# Patient Record
Sex: Male | Born: 1985 | Race: Black or African American | Hispanic: No | Marital: Single
Health system: Southern US, Community
[De-identification: ages and names within clinical notes are randomized; demographics above are authoritative.]

## PROBLEM LIST (undated history)

## (undated) DIAGNOSIS — J45909 Unspecified asthma, uncomplicated: Secondary | ICD-10-CM

## (undated) HISTORY — PX: KNEE SURGERY: SHX244

---
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
## 2011-01-22 ENCOUNTER — Emergency Department (HOSPITAL_COMMUNITY)
Admission: EM | Admit: 2011-01-22 | Discharge: 2011-01-22 | Disposition: A | Discharge status: Home / Self Care | Payer: Self-pay | Attending: Emergency Medicine | Admitting: Emergency Medicine

## 2011-01-22 DIAGNOSIS — M25476 Effusion, unspecified foot: Secondary | ICD-10-CM | POA: Insufficient documentation

## 2011-01-22 DIAGNOSIS — M25473 Effusion, unspecified ankle: Secondary | ICD-10-CM | POA: Insufficient documentation

## 2011-01-22 DIAGNOSIS — R609 Edema, unspecified: Secondary | ICD-10-CM | POA: Insufficient documentation

## 2011-01-22 LAB — BASIC METABOLIC PANEL
BUN: 17 mg/dL (ref 6–23)
CO2: 28 mEq/L (ref 19–32)
Calcium: 9.1 mg/dL (ref 8.4–10.5)
GFR calc non Af Amer: >60 mL/min (ref >60)
Glucose, Bld: 91 mg/dL (ref 70–99)
Potassium: 4.1 mEq/L (ref 3.5–5.1)

## 2011-01-22 LAB — CBC
Hemoglobin: 13.6 g/dL (ref 13.0–17.0)
MCH: 28.9 pg (ref 26.0–34.0)
MCHC: 34.9 g/dL (ref 30.0–36.0)
MCV: 82.8 fL (ref 78.0–100.0)

## 2013-01-22 ENCOUNTER — Emergency Department (HOSPITAL_COMMUNITY): Payer: Self-pay

## 2013-01-22 ENCOUNTER — Encounter (HOSPITAL_COMMUNITY): Payer: Self-pay | Admitting: Adult Health

## 2013-01-22 ENCOUNTER — Emergency Department (HOSPITAL_COMMUNITY)
Admission: EM | Admit: 2013-01-22 | Discharge: 2013-01-23 | Disposition: A | Discharge status: Home / Self Care | Payer: Self-pay | Attending: Emergency Medicine | Admitting: Emergency Medicine

## 2013-01-22 DIAGNOSIS — J4521 Mild intermittent asthma with (acute) exacerbation: Secondary | ICD-10-CM

## 2013-01-22 DIAGNOSIS — J45901 Unspecified asthma with (acute) exacerbation: Secondary | ICD-10-CM | POA: Insufficient documentation

## 2013-01-22 DIAGNOSIS — IMO0002 Reserved for concepts with insufficient information to code with codable children: Secondary | ICD-10-CM | POA: Insufficient documentation

## 2013-01-22 DIAGNOSIS — M549 Dorsalgia, unspecified: Secondary | ICD-10-CM | POA: Insufficient documentation

## 2013-01-22 DIAGNOSIS — Z79899 Other long term (current) drug therapy: Secondary | ICD-10-CM | POA: Insufficient documentation

## 2013-01-22 DIAGNOSIS — F17200 Nicotine dependence, unspecified, uncomplicated: Secondary | ICD-10-CM | POA: Insufficient documentation

## 2013-01-22 DIAGNOSIS — R079 Chest pain, unspecified: Secondary | ICD-10-CM | POA: Insufficient documentation

## 2013-01-22 HISTORY — DX: Unspecified asthma, uncomplicated: J45.909

## 2013-01-22 MED ORDER — ALBUTEROL SULFATE HFA 108 (90 BASE) MCG/ACT IN AERS: 2.0000 | INHALATION_SPRAY | RESPIRATORY_TRACT | Status: DC | PRN

## 2013-01-22 MED ORDER — ONDANSETRON 4 MG PO TBDP
8.0000 mg | ORAL_TABLET | Freq: Once | ORAL | Status: AC
  Administered 2013-01-22: 8 mg via ORAL
  Filled 2013-01-22: qty 2

## 2013-01-22 MED ORDER — PREDNISONE 20 MG PO TABS
60.0000 mg | ORAL_TABLET | Freq: Once | ORAL | Status: AC
  Administered 2013-01-22: 60 mg via ORAL
  Filled 2013-01-22: qty 3

## 2013-01-22 MED ORDER — ALBUTEROL SULFATE (5 MG/ML) 0.5% IN NEBU
INHALATION_SOLUTION | RESPIRATORY_TRACT | Status: AC
  Filled 2013-01-22: qty 1

## 2013-01-22 MED ORDER — ALBUTEROL SULFATE (5 MG/ML) 0.5% IN NEBU
5.0000 mg | INHALATION_SOLUTION | Freq: Once | RESPIRATORY_TRACT | Status: AC
  Administered 2013-01-22: 5 mg via RESPIRATORY_TRACT

## 2013-01-22 MED ORDER — IPRATROPIUM BROMIDE 0.02 % IN SOLN
0.5000 mg | Freq: Once | RESPIRATORY_TRACT | Status: AC
  Administered 2013-01-22: 0.5 mg via RESPIRATORY_TRACT

## 2013-01-22 MED ORDER — ALBUTEROL SULFATE HFA 108 (90 BASE) MCG/ACT IN AERS
2.0000 | INHALATION_SPRAY | RESPIRATORY_TRACT | Status: DC | PRN
  Administered 2013-01-23: 2 via RESPIRATORY_TRACT
  Filled 2013-01-22: qty 6.7

## 2013-01-22 MED ORDER — PREDNISONE 20 MG PO TABS: 40.0000 mg | ORAL_TABLET | Freq: Every day | ORAL | Status: DC

## 2013-01-22 MED ORDER — IPRATROPIUM BROMIDE 0.02 % IN SOLN
RESPIRATORY_TRACT | Status: AC
  Filled 2013-01-22: qty 2.5

## 2013-01-22 MED ORDER — IPRATROPIUM BROMIDE 0.02 % IN SOLN
0.5000 mg | Freq: Once | RESPIRATORY_TRACT | Status: AC
  Administered 2013-01-22: 0.5 mg via RESPIRATORY_TRACT
  Filled 2013-01-22 (×2): qty 2.5

## 2013-01-22 MED ORDER — ALBUTEROL SULFATE (5 MG/ML) 0.5% IN NEBU
5.0000 mg | INHALATION_SOLUTION | Freq: Once | RESPIRATORY_TRACT | Status: AC
  Administered 2013-01-22: 5 mg via RESPIRATORY_TRACT
  Filled 2013-01-22 (×2): qty 1

## 2013-01-22 NOTE — ED Notes (Signed)
Presents with 2 weeks of asthma exacerbation. Bilateral inspiratory and expiratory wheezes on auscultation. No use of accessory muscles. Sats 95%

## 2013-01-22 NOTE — ED Provider Notes (Signed)
History     CSN: [id number]  Arrival date & time 01/22/13  2035   First MD Initiated Contact with Patient 01/22/13 2210      Chief Complaint  Patient presents with  . Shortness of Breath    (Consider location/radiation/quality/duration/timing/severity/associated sxs/prior treatment) Patient is a 27 y.o. male presenting with shortness of breath. The history is provided by the patient and medical records. No language interpreter was used.  Shortness of Breath Severity:  Moderate Onset quality:  Gradual Duration:  2 weeks Timing:  Constant Progression:  Worsening Chronicity:  New Context: activity and smoke exposure   Context: not animal exposure, not emotional upset, not fumes, not known allergens, not occupational exposure, not pollens, not strong odors, not URI and not weather changes   Relieved by:  None tried Worsened by:  Activity Ineffective treatments:  None tried Associated symptoms: chest pain   Associated symptoms: no abdominal pain, no cough, no diaphoresis, no ear pain, no fever, no headaches, no hemoptysis, no neck pain, no PND, no rash, no sore throat, no sputum production, no syncope, no swollen glands, no vomiting and no wheezing   Risk factors: tobacco use   Risk factors: no recent alcohol use, no family hx of DVT, no hx of cancer, no hx of PE/DVT, no obesity, no oral contraceptive use, no prolonged immobilization and no recent surgery     Brett Gilbert is a 27 y.o. male  with a hx of asthma presents to the Emergency Department complaining of gradual, persistent, progressively worsening SOB onset 2 weeks ago.  Initially only at night, but progressively worsening to being a problem throughout the day.  Denies URI symptoms or sick contacts. Associated symptoms include wheezing, chest tightness, back pain.  Nothing makes it better and exertion makes it worse.  Pt denies fever, chills, headache, abdominal pain, N/V/D, weakness, dizziness, syncope, dysuria.  Pt's last  exacerbation was 2-3 years ago.   Pt does not take any medications for his asthma on a regular basis.      Past Medical History  Diagnosis Date  . Asthma     History reviewed. No pertinent past surgical history.  History reviewed. No pertinent family history.  History  Substance Use Topics  . Smoking status: Current Every Day Smoker    Types: Cigarettes  . Smokeless tobacco: Not on file  . Alcohol Use: No      Review of Systems  Constitutional: Negative for fever, diaphoresis, appetite change, fatigue and unexpected weight change.  HENT: Negative for ear pain, sore throat, mouth sores, neck pain and neck stiffness.   Eyes: Negative for visual disturbance.  Respiratory: Positive for shortness of breath. Negative for cough, hemoptysis, sputum production, chest tightness and wheezing.   Cardiovascular: Positive for chest pain. Negative for syncope and PND.  Gastrointestinal: Negative for nausea, vomiting, abdominal pain, diarrhea and constipation.  Endocrine: Negative for polydipsia, polyphagia and polyuria.  Genitourinary: Negative for dysuria, urgency, frequency and hematuria.  Musculoskeletal: Negative for back pain.  Skin: Negative for rash.  Allergic/Immunologic: Negative for immunocompromised state.  Neurological: Negative for syncope, light-headedness and headaches.  Hematological: Does not bruise/bleed easily.  Psychiatric/Behavioral: Negative for sleep disturbance. The patient is not nervous/anxious.     Allergies  Review of patient's allergies indicates no known allergies.  Home Medications   Current Outpatient Rx  Name  Route  Sig  Dispense  Refill  . Ibuprofen (IBU PO)   Oral   Take 2-3 tablets by mouth 2 (two) times  daily as needed (pain).         [name] albuterol (PROVENTIL HFA;VENTOLIN HFA) 108 (90 BASE) MCG/ACT inhaler   Inhalation   Inhale 2 puffs into the lungs every 4 (four) hours as needed for wheezing or shortness of breath.   1 Inhaler   3   .  predniSONE (DELTASONE) 20 MG tablet   Oral   Take 2 tablets (40 mg total) by mouth daily.   10 tablet   0     BP 141/84  Pulse 78  Temp(Src) 97.8 F (36.6 C) (Oral)  Resp 18  SpO2 97%  Physical Exam  Nursing note and vitals reviewed. Constitutional: He is oriented to person, place, and time. He appears well-developed and well-nourished. No distress.  HENT:  Head: Normocephalic and atraumatic.  Mouth/Throat: Oropharynx is clear and moist. No oropharyngeal exudate.  Eyes: Conjunctivae are normal. Pupils are equal, round, and reactive to light. No scleral icterus.  Neck: Normal range of motion. Neck supple.  Cardiovascular: Normal rate, regular rhythm, normal heart sounds and intact distal pulses.   No murmur heard. Pulmonary/Chest: Effort normal. No accessory muscle usage. Not tachypneic. No respiratory distress. He has decreased breath sounds. He has wheezes (throughout). He has no rhonchi. He has no rales. He exhibits no tenderness and no bony tenderness.  Abdominal: Soft. Bowel sounds are normal. He exhibits no mass. There is no tenderness. There is no rebound and no guarding.  Musculoskeletal: Normal range of motion. He exhibits no edema.  Lymphadenopathy:    He has no cervical adenopathy.  Neurological: He is alert and oriented to person, place, and time. He exhibits normal muscle tone. Coordination normal.  Speech is clear and goal oriented Moves extremities without ataxia  Skin: Skin is warm and dry. No rash noted. He is not diaphoretic. No erythema.  Psychiatric: He has a normal mood and affect.    ED Course  Procedures (including critical care time)  Labs Reviewed - No data to display Dg Chest 2 View  01/22/2013   *RADIOLOGY REPORT*  Clinical Data: Sudden onset of shortness of breath.  History of asthma.  CHEST - 2 VIEW  Comparison: Head  Findings: Heart, mediastinal, hilar contours are normal.  There is a convex left curvature of the upper thoracic spine, favored to  be due to scoliosis, but could be related to patient positioning. Lung volumes appear within normal limits.  No definite hyperinflation.  The lungs are clear. No acute findings in the bones or upper abdomen.  IMPRESSION:  1.  The lungs are clear. 2.  Question mild levoscoliosis of the upper thoracic spine.   Original Report Authenticated By: Britta Mccreedy, M.D.     1. Asthma flare, mild intermittent       MDM  Andreas Blower presents with asthma exacerbation.  Persistent wheezing; will repeat nebulizer.   11:54 PM Pt with resolution of wheezing.  Patient ambulated in ED with O2 saturations maintained >90, no current signs of respiratory distress. Lung exam improved after nebulizer treatment. Prednisone given in the ED and pt will bd dc with 5 day burst. Pt states they are breathing at baseline. Pt has been instructed to continue using prescribed medications and obtain a PCP for further discussion of today's exacerbation.  I have also discussed reasons to return immediately to the ER.  Patient expresses understanding and agrees with plan.        Dahlia Client Shamone Winzer, PA-C 01/22/13 2354

## 2013-01-22 NOTE — ED Provider Notes (Signed)
Medical screening examination/treatment/procedure(s) were performed by non-physician practitioner and as supervising physician I was immediately available for consultation/collaboration.  Lyanne Co, MD 01/22/13 [phone]

## 2013-10-30 ENCOUNTER — Encounter (HOSPITAL_COMMUNITY): Payer: Self-pay | Admitting: Emergency Medicine

## 2013-10-30 ENCOUNTER — Emergency Department (HOSPITAL_COMMUNITY)
Admission: EM | Admit: 2013-10-30 | Discharge: 2013-10-30 | Disposition: A | Discharge status: Home / Self Care | Payer: BC Managed Care – PPO | Attending: Emergency Medicine | Admitting: Emergency Medicine

## 2013-10-30 ENCOUNTER — Emergency Department (HOSPITAL_COMMUNITY): Payer: BC Managed Care – PPO

## 2013-10-30 DIAGNOSIS — J45901 Unspecified asthma with (acute) exacerbation: Secondary | ICD-10-CM | POA: Insufficient documentation

## 2013-10-30 DIAGNOSIS — J45909 Unspecified asthma, uncomplicated: Secondary | ICD-10-CM

## 2013-10-30 DIAGNOSIS — F17200 Nicotine dependence, unspecified, uncomplicated: Secondary | ICD-10-CM | POA: Insufficient documentation

## 2013-10-30 MED ORDER — ALBUTEROL SULFATE (2.5 MG/3ML) 0.083% IN NEBU
5.0000 mg | INHALATION_SOLUTION | Freq: Once | RESPIRATORY_TRACT | Status: AC
  Administered 2013-10-30: 5 mg via RESPIRATORY_TRACT
  Filled 2013-10-30: qty 6

## 2013-10-30 MED ORDER — ALBUTEROL SULFATE HFA 108 (90 BASE) MCG/ACT IN AERS: 1.0000 | INHALATION_SPRAY | Freq: Four times a day (QID) | RESPIRATORY_TRACT | Status: DC | PRN

## 2013-10-30 MED ORDER — ALBUTEROL SULFATE HFA 108 (90 BASE) MCG/ACT IN AERS
2.0000 | INHALATION_SPRAY | RESPIRATORY_TRACT | Status: DC | PRN
  Filled 2013-10-30: qty 6.7

## 2013-10-30 NOTE — Discharge Instructions (Signed)
Please followup with a primary care provider for continued evaluation and treatment of your asthma.   Asthma, Adult Asthma is a recurring condition in which the airways tighten and narrow. Asthma can make it difficult to breathe. It can cause coughing, wheezing, and shortness of breath. Asthma episodes (also called asthma attacks) range from minor to life-threatening. Asthma cannot be cured, but medicines and lifestyle changes can help control it. CAUSES Asthma is believed to be caused by inherited (genetic) and environmental factors, but its exact cause is unknown. Asthma may be triggered by allergens, lung infections, or irritants in the air. Asthma triggers are different for each person. Common triggers include:   Animal dander.  Dust mites.  Cockroaches.  Pollen from trees or grass.  Mold.  Smoke.  Air pollutants such as dust, household cleaners, hair sprays, aerosol sprays, paint fumes, strong chemicals, or strong odors.  Cold air, weather changes, and winds (which increase molds and pollens in the air).  Strong emotional expressions such as crying or laughing hard.  Stress.  Certain medicines (such as aspirin) or types of drugs (such as beta-blockers).  Sulfites in foods and drinks. Foods and drinks that may contain sulfites include dried fruit, potato chips, and sparkling grape juice.  Infections or inflammatory conditions such as the flu, a cold, or an inflammation of the nasal membranes (rhinitis).  Gastroesophageal reflux disease (GERD).  Exercise or strenuous activity. SYMPTOMS Symptoms may occur immediately after asthma is triggered or many hours later. Symptoms include:  Wheezing.  Excessive nighttime or early morning coughing.  Frequent or severe coughing with a common cold.  Chest tightness.  Shortness of breath. DIAGNOSIS  The diagnosis of asthma is made by a review of your medical history and a physical exam. Tests may also be performed. These may  include:  Lung function studies. These tests show how much air you breath in and out.  Allergy tests.  Imaging tests such as X-rays. TREATMENT  Asthma cannot be cured, but it can usually be controlled. Treatment involves identifying and avoiding your asthma triggers. It also involves medicines. There are 2 classes of medicine used for asthma treatment:   Controller medicines. These prevent asthma symptoms from occurring. They are usually taken every day.  Reliever or rescue medicines. These quickly relieve asthma symptoms. They are used as needed and provide short-term relief. Your health care provider will help you create an asthma action plan. An asthma action plan is a written plan for managing and treating your asthma attacks. It includes a list of your asthma triggers and how they may be avoided. It also includes information on when medicines should be taken and when their dosage should be changed. An action plan may also involve the use of a device called a peak flow meter. A peak flow meter measures how well the lungs are working. It helps you monitor your condition. HOME CARE INSTRUCTIONS   Take medicine as directed by your health care provider. Speak with your health care provider if you have questions about how or when to take the medicines.  Use a peak flow meter as directed by your health care provider. Record and keep track of readings.  Understand and use the action plan to help minimize or stop an asthma attack without needing to seek medical care.  Control your home environment in the following ways to help prevent asthma attacks:  Do not smoke. Avoid being exposed to secondhand smoke.  Change your heating and air conditioning filter regularly.  Limit your use of fireplaces and wood stoves.  Get rid of pests (such as roaches and mice) and their droppings.  Throw away plants if you see mold on them.  Clean your floors and dust regularly. Use unscented cleaning  products.  Try to have someone else vacuum for you regularly. Stay out of rooms while they are being vacuumed and for a short while afterward. If you vacuum, use a dust mask from a hardware store, a double-layered or microfilter vacuum cleaner bag, or a vacuum cleaner with a HEPA filter.  Replace carpet with wood, tile, or vinyl flooring. Carpet can trap dander and dust.  Use allergy-proof pillows, mattress covers, and box spring covers.  Wash bed sheets and blankets every week in hot water and dry them in a dryer.  Use blankets that are made of polyester or cotton.  Clean bathrooms and kitchens with bleach. If possible, have someone repaint the walls in these rooms with mold-resistant paint. Keep out of the rooms that are being cleaned and painted.  Wash hands frequently. SEEK MEDICAL CARE IF:   You have wheezing, shortness of breath, or a cough even if taking medicine to prevent attacks.  The colored mucus you cough up (sputum) is thicker than usual.  Your sputum changes from clear or white to yellow, green, gray, or bloody.  You have any problems that may be related to the medicines you are taking (such as a rash, itching, swelling, or trouble breathing).  You are using a reliever medicine more than 2 3 times per week.  Your peak flow is still at 50 79% of you personal best after following your action plan for 1 hour. SEEK IMMEDIATE MEDICAL CARE IF:   You seem to be getting worse and are unresponsive to treatment during an asthma attack.  You are short of breath even at rest.  You get short of breath when doing very little physical activity.  You have difficulty eating, drinking, or talking due to asthma symptoms.  You develop chest pain.  You develop a fast heartbeat.  You have a bluish color to your lips or fingernails.  You are lightheaded, dizzy, or faint.  Your peak flow is less than 50% of your personal best.  You have a fever or persistent symptoms for more  than 2 3 days.  You have a fever and symptoms suddenly get worse. MAKE SURE YOU:   Understand these instructions.  Will watch your condition.  Will get help right away if you are not doing well or get worse. Document Released: 07/24/2005 Document Revised: 03/26/2013 Document Reviewed: 02/20/2013 [facility] Patient Information 2014 [place], [place].

## 2013-10-30 NOTE — ED Provider Notes (Signed)
CSN: [id number]     Arrival date & time 10/30/13  1921 History   First MD Initiated Contact with Patient 10/30/13 2034     Chief Complaint  Patient presents with  . Asthma   HPI  By the patient. Patient is a [age] year old male with history of asthma and is a current smoker presenting with complaints of persistent asthma attack and wheezing. Patient states that he has had infrequent issues with asthma and usually when he does become symptomatic his symptoms will resolve on their own within 5 hours with rest. His symptoms first began yesterday evening and went through the night and all day today without improvement. He does not have any inhalers at home and do not use any treatments. Symptoms were associated with some productive cough of white phlegm. Denies any associated fever, chills or sweats. No chest pain or hemoptysis. He has otherwise been well recently without any URI symptoms. Denies any other aggravating or alleviating factors. No other associated symptoms.   Past Medical History  Diagnosis Date  . Asthma    History reviewed. No pertinent past surgical history. No family history on file. History  Substance Use Topics  . Smoking status: Current Every Day Smoker    Types: Cigarettes  . Smokeless tobacco: Not on file  . Alcohol Use: No    Review of Systems  Constitutional: Negative for fever, chills and diaphoresis.  Respiratory: Positive for cough, shortness of breath and wheezing.   Cardiovascular: Negative for chest pain and palpitations.  All other systems reviewed and are negative.      Allergies  Review of patient's allergies indicates no known allergies.  Home Medications  No current outpatient prescriptions on file. BP 138/78  Pulse 95  Temp(Src) 97.9 F (36.6 C) (Oral)  Resp 28  Ht 6\' 3"  (1.905 m)  Wt 156 lb (70.761 kg)  BMI 19.50 kg/m2  SpO2 98% Physical Exam  Nursing note and vitals reviewed. Constitutional: He is oriented to person, place, and time.  He appears well-developed and well-nourished. No distress.  HENT:  Head: Normocephalic.  Cardiovascular: Normal rate and regular rhythm.   Pulmonary/Chest: Effort normal and breath sounds normal. No respiratory distress. He has no wheezes. He has no rales.  Abdominal: Soft.  Musculoskeletal: Normal range of motion.  Neurological: He is alert and oriented to person, place, and time.  Skin: Skin is warm.  Psychiatric: He has a normal mood and affect. His behavior is normal.    ED Course  Procedures   DIAGNOSTIC STUDIES: Oxygen Saturation is 98% on room air.  COORDINATION OF CARE:  Nursing notes reviewed. Vital signs reviewed. Initial pt interview and examination performed.   8:54 PM-patient seen and evaluated. Patient received breathing treatments in triage. He states he is feeling significant improved. Normal respirations O2 sats on room air. Lungs are clear this time. Does not appear to have a significant asthma exacerbation and if he'll he will be treated adequately with albuterol inhaler to take home. Will withhold steroids at this time. Patient agrees with this plan.    Treatment plan initiated: Medications  albuterol (PROVENTIL HFA;VENTOLIN HFA) 108 (90 BASE) MCG/ACT inhaler 2 puff (not administered)  albuterol (PROVENTIL) (2.5 MG/3ML) 0.083% nebulizer solution 5 mg (5 mg Nebulization Given 10/30/13 1931)       Imaging Review Dg Chest 2 View  10/30/2013   CLINICAL DATA:  Asthma, wheezing, shortness of breath.  EXAM: CHEST  2 VIEW  COMPARISON:  DG CHEST 2 VIEW dated 01/22/2013  FINDINGS: The cardiomediastinal silhouette is within normal limits. The lungs are well inflated and clear. There is no pleural effusion or pneumothorax. Mild levoscoliosis of the upper thoracic spine is unchanged.  IMPRESSION: Clear lungs.   Electronically Signed   By: Sebastian Ache   On: 10/30/2013 20:33      MDM   Final diagnoses:  Asthma        Angus Seller, PA-C 10/30/13 2104

## 2013-10-30 NOTE — ED Provider Notes (Signed)
Medical screening examination/treatment/procedure(s) were performed by non-physician practitioner and as supervising physician I was immediately available for consultation/collaboration.   EKG Interpretation None        Charles B. Bernette MayersSheldon, MD 10/30/13 2119

## 2013-10-30 NOTE — ED Notes (Signed)
Pt reports "astham attack" this afternoon. Recently seen here for the same, rx with albuterol but states that he lost rx. Pt is no longer SOB, lungs clear throughout. States "I feel a million times better now that I got that breathing tx. I am ready to go." NAD. 100% RA.

## 2013-10-30 NOTE — ED Notes (Signed)
Pt. reports asthma attack with wheezing and occasional productive cough onset last night , pt. has no rescue MDI at home . No fever or chills.

## 2013-11-28 ENCOUNTER — Emergency Department (HOSPITAL_COMMUNITY)
Admission: EM | Admit: 2013-11-28 | Discharge: 2013-11-28 | Disposition: A | Discharge status: Home / Self Care | Payer: BC Managed Care – PPO | Attending: Emergency Medicine | Admitting: Emergency Medicine

## 2013-11-28 ENCOUNTER — Encounter (HOSPITAL_COMMUNITY): Payer: Self-pay | Admitting: Emergency Medicine

## 2013-11-28 ENCOUNTER — Emergency Department (HOSPITAL_COMMUNITY): Payer: BC Managed Care – PPO

## 2013-11-28 DIAGNOSIS — L02519 Cutaneous abscess of unspecified hand: Secondary | ICD-10-CM | POA: Insufficient documentation

## 2013-11-28 DIAGNOSIS — W208XXA Other cause of strike by thrown, projected or falling object, initial encounter: Secondary | ICD-10-CM | POA: Insufficient documentation

## 2013-11-28 DIAGNOSIS — Y9389 Activity, other specified: Secondary | ICD-10-CM | POA: Insufficient documentation

## 2013-11-28 DIAGNOSIS — L0390 Cellulitis, unspecified: Secondary | ICD-10-CM

## 2013-11-28 DIAGNOSIS — Y929 Unspecified place or not applicable: Secondary | ICD-10-CM | POA: Insufficient documentation

## 2013-11-28 DIAGNOSIS — F17200 Nicotine dependence, unspecified, uncomplicated: Secondary | ICD-10-CM | POA: Insufficient documentation

## 2013-11-28 DIAGNOSIS — L03019 Cellulitis of unspecified finger: Secondary | ICD-10-CM

## 2013-11-28 DIAGNOSIS — J45909 Unspecified asthma, uncomplicated: Secondary | ICD-10-CM | POA: Insufficient documentation

## 2013-11-28 DIAGNOSIS — S6990XA Unspecified injury of unspecified wrist, hand and finger(s), initial encounter: Principal | ICD-10-CM | POA: Insufficient documentation

## 2013-11-28 DIAGNOSIS — Z23 Encounter for immunization: Secondary | ICD-10-CM | POA: Insufficient documentation

## 2013-11-28 DIAGNOSIS — S6980XA Other specified injuries of unspecified wrist, hand and finger(s), initial encounter: Secondary | ICD-10-CM | POA: Insufficient documentation

## 2013-11-28 DIAGNOSIS — Z79899 Other long term (current) drug therapy: Secondary | ICD-10-CM | POA: Insufficient documentation

## 2013-11-28 MED ORDER — SULFAMETHOXAZOLE-TMP DS 800-160 MG PO TABS: 1.0000 | ORAL_TABLET | Freq: Two times a day (BID) | ORAL | Status: DC

## 2013-11-28 MED ORDER — TETANUS-DIPHTH-ACELL PERTUSSIS 5-2.5-18.5 LF-MCG/0.5 IM SUSP
0.5000 mL | Freq: Once | INTRAMUSCULAR | Status: AC
  Administered 2013-11-28: 0.5 mL via INTRAMUSCULAR
  Filled 2013-11-28: qty 0.5

## 2013-11-28 MED ORDER — CEPHALEXIN 500 MG PO CAPS: 500.0000 mg | ORAL_CAPSULE | Freq: Four times a day (QID) | ORAL | Status: DC

## 2013-11-28 NOTE — ED Notes (Signed)
Pt reports that he was working an oven and a piece of metal fell onto his right index finger. Reports pain and swelling this morning. Redness noted to the joint

## 2013-11-28 NOTE — ED Provider Notes (Signed)
CSN: [id number]     Arrival date & time 11/28/13  1037 History  This chart was scribed for non-physician practitioner, Arthor CaptainAbigail Alyssandra Hulsebus, PA-C, working with Raeford RazorStephen Kohut, MD by Shari HeritageAisha Amuda, ED Scribe. This patient was seen in room TR11C/TR11C and the patient's care was started at 10:50 AM.  Chief Complaint  Patient presents with  . Hand Pain     The history is provided by the patient. No language interpreter was used.    HPI Comments: Andreas BlowerDouglas W Renken is a 28 y.o. male who presents to the Emergency Department complaining of constant, sharp pain to his right middle finger that began this morning. He states that pain is worse with movement of his finger. He rates pain as 6/10 when his hand is at rest and 10/10 with movement. Patient explains that he injured his finger last night when. He states that he was taking apart an oven, when a piece of metal from the oven fell onto his right middle finger.There is associated swelling and erythema to the finger. His tetanus vaccination is not up to date. He denies fever, chills, numbness or weakness in his right hand. He further denies nausea or vomiting.    Past Medical History  Diagnosis Date  . Asthma    Past Surgical History  Procedure Laterality Date  . Knee surgery     History reviewed. No pertinent family history. History  Substance Use Topics  . Smoking status: Current Every Day Smoker    Types: Cigarettes  . Smokeless tobacco: Not on file  . Alcohol Use: No    Review of Systems  Constitutional: Negative for fever and chills.  Gastrointestinal: Negative for nausea and vomiting.  Musculoskeletal: Positive for arthralgias (right 3rd finger).  Neurological: Negative for weakness and numbness.    Allergies  Review of patient's allergies indicates no known allergies.  Home Medications   Prior to Admission medications   Medication Sig Start Date End Date Taking? Authorizing Provider  albuterol (PROVENTIL HFA;VENTOLIN HFA) 108 (90  BASE) MCG/ACT inhaler Inhale 1-2 puffs into the lungs every 6 (six) hours as needed for wheezing or shortness of breath. 10/30/13   Angus SellerPeter S Dammen, PA-C   Triage Vitals: BP 120/74  Pulse 84  Temp(Src) 98.5 F (36.9 C) (Oral)  Resp 18  Ht 6\' 3"  (1.905 m)  Wt 152 lb (68.947 kg)  BMI 19.00 kg/m2  SpO2 100% Physical Exam  Constitutional: He is oriented to person, place, and time. He appears well-developed and well-nourished.  HENT:  Head: Normocephalic and atraumatic.  Eyes: Conjunctivae and EOM are normal.  Neck: Normal range of motion. Neck supple.  Cardiovascular: Normal rate, regular rhythm and normal heart sounds.  Exam reveals no gallop and no friction rub.   No murmur heard. Pulmonary/Chest: Effort normal and breath sounds normal. No respiratory distress. He has no wheezes. He has no rales.  Musculoskeletal:  Swelling over the right PIP of the 3rd finger. Punctate wound inferior to the joint and heat, redness and swelling over that joint. Able to move the joint with pain. Capillary refills less than 3 seconds in right fingers.  Neurological: He is alert and oriented to person, place, and time.  Skin: Skin is warm and dry.  Psychiatric: He has a normal mood and affect. His behavior is normal.    ED Course  Procedures (including critical care time) DIAGNOSTIC STUDIES: Oxygen Saturation is 100% on room air, normal by my interpretation.    COORDINATION OF CARE: 10:56 AM- Patient informed  of current plan for treatment and evaluation and agrees with plan at this time.     Labs Review Labs Reviewed - No data to display  Imaging Review Dg Finger Middle Right  11/28/2013   CLINICAL DATA:  Long finger pain  EXAM: RIGHT MIDDLE FINGER 2+V  COMPARISON:  None.  FINDINGS: Mild soft tissue swelling about the proximal interphalangeal joint of the long finger. No evidence of underlying fracture. No retained radiopaque foreign body. Normal bony mineralization. No lytic or blastic osseous  lesion.  IMPRESSION: Mild soft tissue swelling about the proximal interphalangeal joint without evidence of acute osseous injury.   Electronically Signed   By: Malachy MoanHeath  McCullough M.D.   On: 11/28/2013 11:42     EKG Interpretation None      MDM   Final diagnoses:  Cellulitis    Patient X-Ray negative for obvious fracture or dislocation. Appears to have a developing superficial cellulitis. Doubt septic arthritis or flexor tenosynovitis. Will treat with keflex. Return precautions discussed.  I personally performed the services described in this documentation, which was scribed in my presence. The recorded information has been reviewed and is accurate.     Have a Stacey Draink  Christoper Bushey, PA-C 11/28/13 2130

## 2013-11-28 NOTE — Discharge Instructions (Signed)
°  You have been diagnosed with cellulitis. I am discharging you with the following medications: 1)bactrim and keflex Please take all of your antibiotics until finished!   You may develop abdominal discomfort or diarrhea from the antibiotic.  You may help offset this with probiotics which you can buy or get in yogurt. Do not eat  or take the probiotics until 2 hours after your antibiotic.  Please take tylenol and advil for pain.  HOME CARE INSTRUCTIONS   Take your antibiotics as directed. Finish them even if you start to feel better.  Keep the infected arm or leg elevated to reduce swelling.  Apply a warm cloth to the affected area up to 4 times per day to relieve pain.  Only take over-the-counter or prescription medicines for pain, discomfort, or fever as directed by your caregiver.  Keep all follow-up appointments as directed by your caregiver. SEEK MEDICAL CARE IF:   You notice red streaks coming from the infected area.  Your red area gets larger or turns dark in color.  Your bone or joint underneath the infected area becomes painful after the skin has healed.  Your infection returns in the same area or another area.  You notice a swollen bump in the infected area.  You develop new symptoms. SEEK IMMEDIATE MEDICAL CARE IF:   You have a fever.  You feel very sleepy.  You develop vomiting or diarrhea.  You have a general ill feeling (malaise) with muscle aches and pains.   Cellulitis Cellulitis is an infection of the skin and the tissue beneath it. The infected area is usually red and tender. Cellulitis occurs most often in the arms and lower legs.  CAUSES  Cellulitis is caused by bacteria that enter the skin through cracks or cuts in the skin. The most common types of bacteria that cause cellulitis are Staphylococcus and Streptococcus. SYMPTOMS   Redness and warmth.  Swelling.  Tenderness or pain.  Fever. DIAGNOSIS  Your caregiver can usually determine what is  wrong based on a physical exam. Blood tests may also be done. TREATMENT  Treatment usually involves taking an antibiotic medicine. HOME CARE INSTRUCTIONS   Take your antibiotics as directed. Finish them even if you start to feel better.  Keep the infected arm or leg elevated to reduce swelling.  Apply a warm cloth to the affected area up to 4 times per day to relieve pain.  Only take over-the-counter or prescription medicines for pain, discomfort, or fever as directed by your caregiver.  Keep all follow-up appointments as directed by your caregiver. SEEK MEDICAL CARE IF:   You notice red streaks coming from the infected area.  Your red area gets larger or turns dark in color.  Your bone or joint underneath the infected area becomes painful after the skin has healed.  Your infection returns in the same area or another area.  You notice a swollen bump in the infected area.  You develop new symptoms. SEEK IMMEDIATE MEDICAL CARE IF:   You have a fever.  You feel very sleepy.  You develop vomiting or diarrhea.  You have a general ill feeling (malaise) with muscle aches and pains. MAKE SURE YOU:   Understand these instructions.  Will watch your condition.  Will get help right away if you are not doing well or get worse. Document Released: 05/03/2005 Document Revised: 01/23/2012 Document Reviewed: 10/09/2011 [facility] Patient Information 2014 [place]ExitCare, [place].

## 2013-12-02 NOTE — ED Provider Notes (Signed)
Medical screening examination/treatment/procedure(s) were performed by non-physician practitioner and as supervising physician I was immediately available for consultation/collaboration.   EKG Interpretation None       Sharlon Pfohl, MD 12/02/13 1444 

## 2015-04-13 ENCOUNTER — Emergency Department (HOSPITAL_COMMUNITY)
Admission: EM | Admit: 2015-04-13 | Discharge: 2015-04-13 | Disposition: A | Discharge status: Home / Self Care | Payer: BC Managed Care – PPO | Attending: Emergency Medicine | Admitting: Emergency Medicine

## 2015-04-13 ENCOUNTER — Encounter (HOSPITAL_COMMUNITY): Payer: Self-pay | Admitting: Emergency Medicine

## 2015-04-13 DIAGNOSIS — G8929 Other chronic pain: Secondary | ICD-10-CM | POA: Diagnosis not present

## 2015-04-13 DIAGNOSIS — Z720 Tobacco use: Secondary | ICD-10-CM | POA: Insufficient documentation

## 2015-04-13 DIAGNOSIS — Y9389 Activity, other specified: Secondary | ICD-10-CM | POA: Insufficient documentation

## 2015-04-13 DIAGNOSIS — Y92410 Unspecified street and highway as the place of occurrence of the external cause: Secondary | ICD-10-CM | POA: Insufficient documentation

## 2015-04-13 DIAGNOSIS — S39012A Strain of muscle, fascia and tendon of lower back, initial encounter: Secondary | ICD-10-CM | POA: Insufficient documentation

## 2015-04-13 DIAGNOSIS — Z79899 Other long term (current) drug therapy: Secondary | ICD-10-CM | POA: Diagnosis not present

## 2015-04-13 DIAGNOSIS — Y998 Other external cause status: Secondary | ICD-10-CM | POA: Diagnosis not present

## 2015-04-13 DIAGNOSIS — S3992XA Unspecified injury of lower back, initial encounter: Secondary | ICD-10-CM | POA: Diagnosis present

## 2015-04-13 DIAGNOSIS — J45909 Unspecified asthma, uncomplicated: Secondary | ICD-10-CM | POA: Diagnosis not present

## 2015-04-13 DIAGNOSIS — T148XXA Other injury of unspecified body region, initial encounter: Secondary | ICD-10-CM

## 2015-04-13 MED ORDER — IBUPROFEN 800 MG PO TABS: 800.0000 mg | ORAL_TABLET | Freq: Three times a day (TID) | ORAL | Status: DC

## 2015-04-13 MED ORDER — METHOCARBAMOL 500 MG PO TABS
1000.0000 mg | ORAL_TABLET | Freq: Two times a day (BID) | ORAL | Status: DC
Start: 2015-04-13 — End: 2015-06-27
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
## 2015-06-27 ENCOUNTER — Emergency Department (HOSPITAL_COMMUNITY)
Admission: EM | Admit: 2015-06-27 | Discharge: 2015-06-27 | Disposition: A | Discharge status: Home / Self Care | Payer: BC Managed Care – PPO | Attending: Emergency Medicine | Admitting: Emergency Medicine

## 2015-06-27 ENCOUNTER — Encounter (HOSPITAL_COMMUNITY): Payer: Self-pay

## 2015-06-27 ENCOUNTER — Emergency Department (HOSPITAL_COMMUNITY): Payer: BC Managed Care – PPO

## 2015-06-27 DIAGNOSIS — J45901 Unspecified asthma with (acute) exacerbation: Secondary | ICD-10-CM | POA: Insufficient documentation

## 2015-06-27 DIAGNOSIS — Z87891 Personal history of nicotine dependence: Secondary | ICD-10-CM | POA: Insufficient documentation

## 2015-06-27 DIAGNOSIS — Z791 Long term (current) use of non-steroidal anti-inflammatories (NSAID): Secondary | ICD-10-CM | POA: Insufficient documentation

## 2015-06-27 DIAGNOSIS — Z79899 Other long term (current) drug therapy: Secondary | ICD-10-CM | POA: Insufficient documentation

## 2015-06-27 MED ORDER — PREDNISONE 20 MG PO TABS: ORAL_TABLET | ORAL | Status: DC

## 2015-06-27 MED ORDER — PREDNISONE 20 MG PO TABS
60.0000 mg | ORAL_TABLET | Freq: Once | ORAL | Status: AC
  Administered 2015-06-27: 60 mg via ORAL
  Filled 2015-06-27: qty 3

## 2015-06-27 MED ORDER — ALBUTEROL SULFATE HFA 108 (90 BASE) MCG/ACT IN AERS
2.0000 | INHALATION_SPRAY | RESPIRATORY_TRACT | Status: DC | PRN
Start: 2015-06-27 — End: 2015-11-15

## 2015-06-27 MED ORDER — IPRATROPIUM-ALBUTEROL 0.5-2.5 (3) MG/3ML IN SOLN
3.0000 mL | Freq: Once | RESPIRATORY_TRACT | Status: AC
  Administered 2015-06-27: 3 mL via RESPIRATORY_TRACT
  Filled 2015-06-27: qty 3

## 2015-06-27 MED ORDER — ALBUTEROL SULFATE (2.5 MG/3ML) 0.083% IN NEBU
5.0000 mg | INHALATION_SOLUTION | Freq: Once | RESPIRATORY_TRACT | Status: AC
Start: 2015-06-27 — End: 2015-06-27
  Administered 2015-06-27: 5 mg via RESPIRATORY_TRACT
  Filled 2015-06-27: qty 6

## 2015-06-27 MED ORDER — ALBUTEROL SULFATE (2.5 MG/3ML) 0.083% IN NEBU
5.0000 mg | INHALATION_SOLUTION | Freq: Once | RESPIRATORY_TRACT | Status: DC
  Filled 2015-06-27: qty 6

## 2015-06-27 MED ORDER — ALBUTEROL SULFATE HFA 108 (90 BASE) MCG/ACT IN AERS
2.0000 | INHALATION_SPRAY | Freq: Once | RESPIRATORY_TRACT | Status: AC
  Administered 2015-06-27: 2 via RESPIRATORY_TRACT
  Filled 2015-06-27: qty 6.7

## 2015-06-27 NOTE — ED Notes (Signed)
Pt. Coming from home with c/o asthma attack starting last night. Pt. Reports not being able to find his inhaler. Audible wheezing heard. Pt. Has no signs of distress at this time.

## 2015-06-27 NOTE — ED Provider Notes (Signed)
CSN: [id number]     Arrival date & time 06/27/15  4098 History  By signing my name below, I, Tanda Rockers, attest that this documentation has been prepared under the direction and in the presence of [employer], PA-C. Electronically Signed: Tanda Rockers, ED Scribe. 06/27/2015. 10:08 AM.   Chief Complaint  Patient presents with  . Asthma   The history is provided by the patient. No language interpreter was used.     HPI Comments: Brett Gilbert is a 29 y.o. male with hx asthma who presents to the Emergency Department complaining of gradual onset, constant, asthma exacerbation that began last night. Pt reports that his asthma usually flares up when he "gets sick." He mentions having cold like symptoms including rhinorrhea, mild cough, and subjective fever. He has an albuterol inhaler but could not find it, prompting him to come to the ED today. Pt had a breathing treatment in the ED with some relief. Denies any other associated symptoms. He has been hospitalized in the past for his asthma. Pt has never had to be intubated in the past.   Past Medical History  Diagnosis Date  . Asthma    Past Surgical History  Procedure Laterality Date  . Knee surgery     History reviewed. No pertinent family history. Social History  Substance Use Topics  . Smoking status: Former Smoker    Types: Cigarettes    Quit date: 01/06/2015  . Smokeless tobacco: None  . Alcohol Use: No    Review of Systems  A complete 10 system review of systems was obtained and all systems are negative except as noted in the HPI and PMH.    Allergies  Review of patient's allergies indicates no known allergies.  Home Medications   Prior to Admission medications   Medication Sig Start Date End Date Taking? Authorizing Provider  albuterol (PROVENTIL HFA;VENTOLIN HFA) 108 (90 BASE) MCG/ACT inhaler Inhale 1-2 puffs into the lungs every 6 (six) hours as needed for wheezing or shortness of breath.    Historical  Provider, MD  cephALEXin (KEFLEX) 500 MG capsule Take 1 capsule (500 mg total) by mouth 4 (four) times daily. Patient not taking: Reported on 04/13/2015 11/28/13   Arthor Captain, PA-C  diphenhydrAMINE (BENADRYL) 25 mg capsule Take 25 mg by mouth daily as needed for allergies.    Historical Provider, MD  ibuprofen (ADVIL,MOTRIN) 800 MG tablet Take 1 tablet (800 mg total) by mouth 3 (three) times daily. 04/13/15   Elpidio Anis, PA-C  methocarbamol (ROBAXIN) 500 MG tablet Take 2 tablets (1,000 mg total) by mouth 2 (two) times daily. 04/13/15   Elpidio Anis, PA-C  sulfamethoxazole-trimethoprim (BACTRIM DS) 800-160 MG per tablet Take 1 tablet by mouth 2 (two) times daily. Patient not taking: Reported on 04/13/2015 11/28/13   Arthor Captain, PA-C   Triage Vitals: BP 136/86 mmHg  Pulse 101  Temp(Src) 98.1 F (36.7 C) (Oral)  Resp 18  Ht  (1.88 m)  Wt 150 lb (68.04 kg)  BMI 19.25 kg/m2  SpO2 96%   Physical Exam  Constitutional: He is oriented to person, place, and time. He appears well-developed and well-nourished. No distress.  HENT:  Head: Normocephalic and atraumatic.  Eyes: Conjunctivae and EOM are normal.  Neck: Neck supple. No tracheal deviation present.  Cardiovascular: Normal rate.   Pulmonary/Chest: Effort normal. No respiratory distress. He has wheezes.  Mild scattered expiratory wheezing Conductive upper airway sounds  Musculoskeletal: Normal range of motion.  Neurological: He is alert and  oriented to person, place, and time.  Skin: Skin is warm and dry.  Psychiatric: He has a normal mood and affect. His behavior is normal.  Nursing note and vitals reviewed.   ED Course  Procedures (including critical care time)  DIAGNOSTIC STUDIES: Oxygen Saturation is 96% on RA, normal by my interpretation.    COORDINATION OF CARE: 10:07 AM-Discussed treatment plan which includes albuterol inhaler to take home and referral for PCP with pt at bedside and pt agreed to plan.   Labs  Review Labs Reviewed - No data to display  Imaging Review Dg Chest 2 View  06/27/2015  CLINICAL DATA:  Patient with recent asthma attack. Sub sternal chest pain. EXAM: CHEST  2 VIEW COMPARISON:  Chest radiograph 10/30/2013. FINDINGS: Stable cardiac and mediastinal contours. No consolidative pulmonary opacities. No pleural effusion or pneumothorax. Regional skeleton is unremarkable. IMPRESSION: No acute cardiopulmonary process. Electronically Signed   By: Annia Beltrew  Davis M.D.   On: 06/27/2015 10:21   I have personally reviewed and evaluated these images as part of my medical decision-making.   EKG Interpretation None      MDM   Final diagnoses:  Asthma exacerbation   Filed Vitals:   06/27/15 0903 06/27/15 1028  BP: 136/86 135/90  Pulse: 101 93  Temp: 98.1 F (36.7 C) 98.7 F (37.1 C)  TempSrc: Oral Oral  Resp: 18 17  Height: 6\' 2"  (1.88 m)   Weight: 150 lb (68.04 kg)   SpO2: 96% 99%    Medications  predniSONE (DELTASONE) tablet 60 mg (60 mg Oral Given 06/27/15 0929)  ipratropium-albuterol (DUONEB) 0.5-2.5 (3) MG/3ML nebulizer solution 3 mL (3 mLs Nebulization Given 06/27/15 0929)  albuterol (PROVENTIL) (2.5 MG/3ML) 0.083% nebulizer solution 5 mg (5 mg Nebulization Given 06/27/15 1010)  albuterol (PROVENTIL HFA;VENTOLIN HFA) 108 (90 BASE) MCG/ACT inhaler 2 puff (2 puffs Inhalation Given 06/27/15 1010)    Brett Gilbert is 29 y.o. male presenting with asthma exacerbation, no respiratory distress. Patient with improvement after DuoNeb, still mild scattered expiratory wheezing. Will reevaluate after second neb.  Lung sounds with excellent air movement, only conducted upper airway sounds now. Started on prednisone burst and given an inhaler in the ED, resource guide given for outpatient care.  Evaluation does not show pathology that would require ongoing emergent intervention or inpatient treatment. Pt is hemodynamically stable and mentating appropriately. Discussed findings and  plan with patient/guardian, who agrees with care plan. All questions answered. Return precautions discussed and outpatient follow up given.   Discharge Medication List as of 06/27/2015 10:34 AM    START taking these medications   Details  predniSONE (DELTASONE) 20 MG tablet 3 tabs po day one, then 2 po daily x 4 days, Print         I personally performed the services described in this documentation, which was scribed in my presence. The recorded information has been reviewed and is accurate.     Wynetta Emeryicole Amiri Riechers, PA-C 06/27/15 1132  Arby BarretteMarcy Pfeiffer, MD 06/28/15 [phone]

## 2015-06-27 NOTE — Discharge Instructions (Signed)
Do not hesitate to return to the emergency room for any new, worsening or concerning symptoms. ° °Please obtain primary care using resource guide below. Let them know that you were seen in the emergency room and that they will need to obtain records for further outpatient management. ° ° °Asthma, Adult °Asthma is a recurring condition in which the airways tighten and narrow. Asthma can make it difficult to breathe. It can cause coughing, wheezing, and shortness of breath. Asthma episodes, also called asthma attacks, range from minor to life-threatening. Asthma cannot be cured, but medicines and lifestyle changes can help control it. °CAUSES °Asthma is believed to be caused by inherited (genetic) and environmental factors, but its exact cause is unknown. Asthma may be triggered by allergens, lung infections, or irritants in the air. Asthma triggers are different for each person. Common triggers include:  °· Animal dander. °· Dust mites. °· Cockroaches. °· Pollen from trees or grass. °· Mold. °· Smoke. °· Air pollutants such as dust, household cleaners, hair sprays, aerosol sprays, paint fumes, strong chemicals, or strong odors. °· Cold air, weather changes, and winds (which increase molds and pollens in the air). °· Strong emotional expressions such as crying or laughing hard. °· Stress. °· Certain medicines (such as aspirin) or types of drugs (such as beta-blockers). °· Sulfites in foods and drinks. Foods and drinks that may contain sulfites include dried fruit, potato chips, and sparkling grape juice. °· Infections or inflammatory conditions such as the flu, a cold, or an inflammation of the nasal membranes (rhinitis). °· Gastroesophageal reflux disease (GERD). °· Exercise or strenuous activity. °SYMPTOMS °Symptoms may occur immediately after asthma is triggered or many hours later. Symptoms include: °· Wheezing. °· Excessive nighttime or early morning coughing. °· Frequent or severe coughing with a common  cold. °· Chest tightness. °· Shortness of breath. °DIAGNOSIS  °The diagnosis of asthma is made by a review of your medical history and a physical exam. Tests may also be performed. These may include: °· Lung function studies. These tests show how much air you breathe in and out. °· Allergy tests. °· Imaging tests such as X-rays. °TREATMENT  °Asthma cannot be cured, but it can usually be controlled. Treatment involves identifying and avoiding your asthma triggers. It also involves medicines. There are 2 classes of medicine used for asthma treatment:  °· Controller medicines. These prevent asthma symptoms from occurring. They are usually taken every day. °· Reliever or rescue medicines. These quickly relieve asthma symptoms. They are used as needed and provide short-term relief. °Your health care provider will help you create an asthma action plan. An asthma action plan is a written plan for managing and treating your asthma attacks. It includes a list of your asthma triggers and how they may be avoided. It also includes information on when medicines should be taken and when their dosage should be changed. An action plan may also involve the use of a device called a peak flow meter. A peak flow meter measures how well the lungs are working. It helps you monitor your condition. °HOME CARE INSTRUCTIONS  °· Take medicines only as directed by your health care provider. Speak with your health care provider if you have questions about how or when to take the medicines. °· Use a peak flow meter as directed by your health care provider. Record and keep track of readings. °· Understand and use the action plan to help minimize or stop an asthma attack without needing to seek medical care. °·   Control your home environment in the following ways to help prevent asthma attacks: °¨ Do not smoke. Avoid being exposed to secondhand smoke. °¨ Change your heating and air conditioning filter regularly. °¨ Limit your use of fireplaces and  wood stoves. °¨ Get rid of pests (such as roaches and mice) and their droppings. °¨ Throw away plants if you see mold on them. °¨ Clean your floors and dust regularly. Use unscented cleaning products. °¨ Try to have someone else vacuum for you regularly. Stay out of rooms while they are being vacuumed and for a short while afterward. If you vacuum, use a dust mask from a hardware store, a double-layered or microfilter vacuum cleaner bag, or a vacuum cleaner with a HEPA filter. °¨ Replace carpet with wood, tile, or vinyl flooring. Carpet can trap dander and dust. °¨ Use allergy-proof pillows, mattress covers, and box spring covers. °¨ Wash bed sheets and blankets every week in hot water and dry them in a dryer. °¨ Use blankets that are made of polyester or cotton. °¨ Clean bathrooms and kitchens with bleach. If possible, have someone repaint the walls in these rooms with mold-resistant paint. Keep out of the rooms that are being cleaned and painted. °¨ Wash hands frequently. °SEEK MEDICAL CARE IF:  °· You have wheezing, shortness of breath, or a cough even if taking medicine to prevent attacks. °· The colored mucus you cough up (sputum) is thicker than usual. °· Your sputum changes from clear or white to yellow, green, gray, or bloody. °· You have any problems that may be related to the medicines you are taking (such as a rash, itching, swelling, or trouble breathing). °· You are using a reliever medicine more than 2-3 times per week. °· Your peak flow is still at 50-79% of your personal best after following your action plan for 1 hour. °· You have a fever. °SEEK IMMEDIATE MEDICAL CARE IF:  °· You seem to be getting worse and are unresponsive to treatment during an asthma attack. °· You are short of breath even at rest. °· You get short of breath when doing very little physical activity. °· You have difficulty eating, drinking, or talking due to asthma symptoms. °· You develop chest pain. °· You develop a fast  heartbeat. °· You have a bluish color to your lips or fingernails. °· You are light-headed, dizzy, or faint. °· Your peak flow is less than 50% of your personal best. °  °This information is not intended to replace advice given to you by your health care provider. Make sure you discuss any questions you have with your health care provider. °  °Document Released: 07/24/2005 Document Revised: 04/14/2015 Document Reviewed: 02/20/2013 °Elsevier Interactive Patient Education ©2016 [employer]vier Inc[employer] ° ° °Emergency Department Resource Guide °1) Find a Doctor and Pay Out of Pocket °Although you won't have to find out who is covered by your insurance plan, it is a good idea to ask around and get recommendations. You will then need to call the office and see if the doctor you have chosen will accept you as a new patient and what types of options they offer for patients who are self-pay. Some doctors offer discounts or will set up payment plans for their patients who do not have insurance, but you will need to ask so you aren't surprised when you get to your appointment. ° °2) Contact Your Local Health Department °Not all health departments have doctors that can see patients for sick visits, but   many do, so it is worth a call to see if yours does. If you don't know where your local health department is, you can check in your phone book. The CDC also has a tool to help you locate your state's health department, and many state websites also have listings of all of their local health departments. ° °3) Find a Walk-in Clinic °If your illness is not likely to be very severe or complicated, you may want to try a walk in clinic. These are popping up all over the country in pharmacies, drugstores, and shopping centers. They're usually staffed by nurse practitioners or physician assistants that have been trained to treat common illnesses and complaints. They're usually fairly quick and inexpensive. However, if you have serious medical  issues or chronic medical problems, these are probably not your best option. ° °No Primary Care Doctor: °- Call [facility] at  832-8000 - they can help you locate a primary care doctor that  accepts your insurance, provides certain services, etc. °- Physician Referral Service- 1-800-533-3463 ° °Chronic Pain Problems: °Organization         Address  Phone   Notes  °[facility]  (336) 297-2271 Patients need to be referred by their primary care doctor.  ° °Medication Assistance: °Organization         Address  Phone   Notes  °[place] [facility] [address] °[place], [place] [address] (336) 641-8030 --Must be a resident of [place] °-- Must have NO insurance coverage whatsoever (no Medicaid/ Medicare, etc.) °-- The pt. MUST have a primary care doctor that directs their care regularly and follows them in the community °  °[facility]  (866) 331-1348   °[employer]  (888) 892-1162   ° °Agencies that provide inexpensive medical care: °Organization         Address  Phone   Notes  °[facility]  (336) 832-8035   °[facility]    (336) 832-7272   °[facility] [address] °[place], [place] [address] (336) 832-4777   °[facility] [address], °[place] (336) 271-4999   °[facility]    (336) 373-0678   °[facility]    (336) 272-1050   °[facility] ° [address], [place] Phone:  (336) 832-4444, Fax:  (336) 832-4440 Hours of Operation:  9 am - 6 pm, M-F.  Also accepts Medicaid/Medicare and self-pay.  °[facility] ° [address], [place] Phone: (336) 832-3150, Fax: (336) [phone]. Hours of Operation:  8:30 am - 5:30 pm, M-F.  Also accepts Medicaid and self-pay.  °[facility] [place] [address], [place] Phone: (336) 878-6027   °[facility] [address], [place], [place]  (336)723-1848, Ext. 123 Mondays & Thursdays: 7-9 AM.  First 15 patients are seen on a first come, first serve basis. °  ° °Medicaid-accepting [place] Providers: ° °Organization         Address  Phone   Notes  °[facility] [address], [place] (336) 641-2100 Also accepts self-pay patients.  °[facility] [address], [place] ° (336) 856-9996   °[facility] [address],  (336) 288-8857   °[facility] [address] [place] [address],  (336) 299-7000   °Veita   Bland [address], [place]  ° (336) [phone] Only accepts [place] Access Medicaid patients after they have their name applied to their card.  ° °Self-Pay (no insurance) in [place]: ° °Organization         Address  Phone   Notes  °Sickle Cell Patients, [facility] [address], [place] (336) 832-1970   °[facility] [address], [place] (336) 832-4400   °[facility] [place] ° [address], [place] (336) 992-4800   °[facility]/Dr. Osei-Bonsu ° [address] [place] [address], [place] or [address], [place] (336) [phone] Phone number for both [place] and [place] locations is the same.  °[facility] [address], [place] (336) 299-0000   °[facility] [address] [place] [address], [place] or [address] (336) 852-7530 °(336) 878-2260   °[facility] [address], [place] (336) 350-1642, phone; (336) 294-5005, fax Sees patients 1st and 3rd Saturday of every month.  Must not qualify for public or private insurance (i.e. Medicaid, Medicare, [place] Health Choice, Veterans' Benefits) • Household income should be no more than 200% of the poverty level •The clinic cannot treat you if you are pregnant or think you are pregnant • Sexually transmitted  diseases are not treated at the clinic.  ° ° °Dental Care: °Organization         Address  Phone  Notes  °[facility] [address], [place] (336) 641-6152 Accepts children up to age 21 who are enrolled in Medicaid or [place] Health Choice; pregnant women with a Medicaid card; and children who have applied for Medicaid or [place] Health Choice, but were declined, whose parents can pay a reduced fee at time of service.  °[facility] [place]  [address], [place] (336) 641-7733 Accepts children up to age 21 who are enrolled in Medicaid or [place] Health Choice; pregnant women with a Medicaid card; and children who have applied for Medicaid or [place] Health Choice, but were declined, whose parents can pay a reduced fee at time of service.  °[facility] ° [address], [place] (336) 641-4533 Patients are seen by appointment only. Walk-ins are not accepted. [facility] will see patients 18 years of age and older. °Monday - Tuesday (8am-5pm) °Most Wednesdays (8:30-5pm) °$30 per visit, cash only  °[facility] ° [address], [place] (336) [phone] Patients are seen by appointment only. Walk-ins are not accepted. [facility] will see patients 18 years of age and older. °One Wednesday Evening (Monthly: Volunteer Based).  $30 per visit, cash only  °[facility]  (919) 537-3737 for adults; Children under age 4, call Graduate Pediatric Dentistry at ([phone]) [phone]. Children aged 4-14, please call ([phone]) [phone] to request a pediatric application. ° Dental services are provided in all areas of dental care including fillings, crowns and bridges, complete and partial dentures, implants, gum treatment, root canals, and extractions. Preventive care is also provided. Treatment is provided to both adults and children. °Patients are selected via a  lottery and there is often a waiting list. °  °[facility] [address], ° ° (336) 763-8833 [email or web address] °  °[facility] [address], [place], [place] (336)723-1848, Ext. 123   Second and Fourth Thursday of each month, opens at 6:30 AM; Clinic ends at 9 AM.  Patients are seen on a first-come first-served basis, and a limited number are seen during each clinic.  ° °[facility] ° [address], [place], [place] (336) 723-7904   Eligibility Requirements °You must have lived in [place], [place], or [place] counties for at least the last three months. °  You cannot be eligible for state or federal sponsored healthcare insurance, including Veterans Administration, Medicaid, or Medicare. °  You generally cannot be eligible for healthcare insurance through your employer.  °  How to apply: °Eligibility screenings are held every Tuesday and Wednesday afternoon from 1:00 pm until 4:00 pm. You do not need an appointment for the interview!  °[facility] [address], [place], [place] 336-631-2330   °[facility]  336-342-8273   °[facility]  336-703-3100   ° [facility]  336-570-6415   ° °Behavioral Health Resources in the Community: °Intensive Outpatient Programs °Organization         Address  Phone  Notes  °[facility] [address], [place], [place] 336-878-6098   °[facility] [address], [place], [place] 336-832-9800   °[facility] [address], [place], [place] ° 336-882-2125   °[facility] [address],  °[place], [place] 1-800-853-5163 or 336-641-4981   °Substance Abuse Resources °Organization         Address  Phone  Notes  °[facility]  336-882-2125   °[facility]  336-784-9470   °[facility]  336-285-9073   °[facility]  336-845-3988   °Residential &  Outpatient Substance Abuse Program  1-800-659-3381   °Psychological Services °Organization         Address  Phone  Notes  °[facility]  336- 832-9600   °[facility]  336- 378-7881   °[facility] [address], [place] 1-800-853-5163 or 336-641-4981   ° °Mobile Crisis Teams °Organization         Address  Phone  Notes  °[facility], Mobile Crisis Care Unit  1-877-626-1772   °Assertive °[facility] ° [address] [place], [place] 336-834-9664   °Sharon DeEsch [address] °[place] 336-554-5454   ° °Self-Help/Support Groups °Organization         Address  Phone             Notes  °[facility] - variety of support groups  336- 373-1402 Call for more information  °Narcotics Anonymous (NA), [facility] [address], °[place]  2 meetings at this location  ° °Residential Treatment Programs °Organization         Address  Phone  Notes  °[facility] [address],    °[place]  1-866-801-8205   °[facility] ° [address], [place], [place] 704-293-8524   °[facility] [address], [place] 336-845-3988 Admissions: 8am-3pm M-F  °[facility] [address]    °[place], [place] 336-841-1104   °[facility] [address], [place], [place] 336-379-7146   °[facility] [address]  °[place],  336-285-9073   °[facility] Intensive Outpatient [address], [place],  336-852-3033   °[facility] [address]  °[place], [place] 1-877-615-2722 or 336-784-9470   °[facility] [address] [place], [place] 336-227-7417 Accepts Medicaid  °[facility] [address]  °[place] 1-800-659-3381 Substance Abuse/Addiction Treatment  ° °[place] Behavioral Health Resources °Organization          Address  Phone  Notes  °[facility]  (888) 581-9988   °Julie Brannon, PhD [address] [place], [place]   (336) 349-5553 or (336) 951-0000   °[facility]   [address] °[place], [place] (336) 349-4454   °[facility] [address], [place], [place] (336) 342-8316 Insurance/Medicaid/sponsorship through [facility]  °[facility] [address]                                    [place], [place] (336) 342-8316 Therapy/tele-psych/case  °[facility] [address]  ° [place], [place] (336) 349-2233    °Dr. Arfeen  (336) 349-4544   °[facility] [place]  [employer] [place] [facility] 1) [address],  °2) [address], [place] °3)  [address], [place] (336) 349-3220 °(336) 342-7768 ° °(336) 342-8140   °[place] [facility] (336) 342-1394 or (336) 342-3537 (After Hours)    ° ° ° °

## 2015-11-15 ENCOUNTER — Encounter (HOSPITAL_COMMUNITY): Payer: Self-pay | Admitting: *Deleted

## 2015-11-15 ENCOUNTER — Emergency Department (HOSPITAL_COMMUNITY)
Admission: EM | Admit: 2015-11-15 | Discharge: 2015-11-15 | Disposition: A | Discharge status: Home / Self Care | Payer: Self-pay | Attending: Emergency Medicine | Admitting: Emergency Medicine

## 2015-11-15 ENCOUNTER — Emergency Department (HOSPITAL_COMMUNITY): Payer: Self-pay

## 2015-11-15 DIAGNOSIS — J45909 Unspecified asthma, uncomplicated: Secondary | ICD-10-CM

## 2015-11-15 DIAGNOSIS — R51 Headache: Secondary | ICD-10-CM | POA: Insufficient documentation

## 2015-11-15 DIAGNOSIS — Z87891 Personal history of nicotine dependence: Secondary | ICD-10-CM | POA: Insufficient documentation

## 2015-11-15 DIAGNOSIS — R079 Chest pain, unspecified: Secondary | ICD-10-CM | POA: Insufficient documentation

## 2015-11-15 DIAGNOSIS — J45901 Unspecified asthma with (acute) exacerbation: Secondary | ICD-10-CM | POA: Insufficient documentation

## 2015-11-15 DIAGNOSIS — R0600 Dyspnea, unspecified: Secondary | ICD-10-CM

## 2015-11-15 MED ORDER — PREDNISONE 20 MG PO TABS: ORAL_TABLET | ORAL | Status: DC

## 2015-11-15 MED ORDER — ALBUTEROL SULFATE HFA 108 (90 BASE) MCG/ACT IN AERS
1.0000 | INHALATION_SPRAY | RESPIRATORY_TRACT | Status: DC
  Administered 2015-11-15: 2 via RESPIRATORY_TRACT
  Filled 2015-11-15: qty 6.7

## 2015-11-15 MED ORDER — ALBUTEROL SULFATE HFA 108 (90 BASE) MCG/ACT IN AERS: 1.0000 | INHALATION_SPRAY | RESPIRATORY_TRACT | Status: DC | PRN

## 2015-11-15 MED ORDER — PREDNISONE 20 MG PO TABS
60.0000 mg | ORAL_TABLET | Freq: Once | ORAL | Status: AC
  Administered 2015-11-15: 60 mg via ORAL
  Filled 2015-11-15: qty 3

## 2015-11-15 MED ORDER — ALBUTEROL SULFATE (2.5 MG/3ML) 0.083% IN NEBU
5.0000 mg | INHALATION_SOLUTION | Freq: Once | RESPIRATORY_TRACT | Status: AC
  Administered 2015-11-15: 5 mg via RESPIRATORY_TRACT

## 2015-11-15 MED ORDER — IPRATROPIUM BROMIDE 0.02 % IN SOLN
0.5000 mg | Freq: Once | RESPIRATORY_TRACT | Status: AC
  Administered 2015-11-15: 0.5 mg via RESPIRATORY_TRACT

## 2015-11-15 NOTE — ED Provider Notes (Signed)
CSN: [id number]     Arrival date & time 11/15/15  0612 History   First MD Initiated Contact with Patient 11/15/15 [phone]     Chief Complaint  Patient presents with  . Asthma     (Consider location/radiation/quality/duration/timing/severity/associated sxs/prior Treatment) HPI Comments: Patient is a 30 year old male with history of asthma who presents with shortness of breath for 2 days. Patient reports that this happened before and usually happens in pollen season. Patient reported 2 days ago that his shortness of breath was mild and became worse last night after work. He states that he "couldn't breathe at all/" patient does not take any maintenance medications and does not have a rescue inhaler. Patient states he does take Benadryl sometimes during pollen season, but he did not take any this time. He reports associated chest tightness, headache, cough, and runny nose. Patient denies fevers, chest pain, abdominal pain, nausea, vomiting, dysuria.  Patient is a 30 y.o. male presenting with asthma. The history is provided by the patient.  Asthma Associated symptoms include coughing and headaches. Pertinent negatives include no abdominal pain, chest pain, chills, fever, myalgias, nausea, rash, sore throat or vomiting.    Past Medical History  Diagnosis Date  . Asthma    Past Surgical History  Procedure Laterality Date  . Knee surgery     No family history on file. Social History  Substance Use Topics  . Smoking status: Former Smoker    Types: Cigarettes    Quit date: 01/06/2015  . Smokeless tobacco: None  . Alcohol Use: No    Review of Systems  Constitutional: Negative for fever and chills.  HENT: Positive for rhinorrhea. Negative for facial swelling and sore throat.   Respiratory: Positive for cough, chest tightness and shortness of breath.   Cardiovascular: Negative for chest pain.  Gastrointestinal: Negative for nausea, vomiting and abdominal pain.  Genitourinary: Negative for  dysuria.  Musculoskeletal: Negative for myalgias.  Skin: Negative for rash and wound.  Neurological: Positive for headaches.  Psychiatric/Behavioral: The patient is not nervous/anxious.       Allergies  Review of patient's allergies indicates no known allergies.  Home Medications   Prior to Admission medications   Medication Sig Start Date End Date Taking? Authorizing Provider  diphenhydrAMINE (BENADRYL) 25 mg capsule Take 25 mg by mouth every 6 (six) hours as needed for allergies.   Yes Historical Provider, MD  albuterol (PROVENTIL HFA;VENTOLIN HFA) 108 (90 Base) MCG/ACT inhaler Inhale 1-2 puffs into the lungs every 4 (four) hours as needed for wheezing or shortness of breath. 11/15/15   Emi HolesAlexandra M Tamarra Geiselman, PA-C  predniSONE (DELTASONE) 20 MG tablet 3 tabs po day one, then 2 po daily x 4 days 11/15/15   Waylan BogaAlexandra M Reisha Wos, PA-C   BP 100/57 mmHg  Pulse 102  Temp(Src) 97.6 F (36.4 C)  Resp 18  Ht 6\' 3"  (1.905 m)  Wt 68.125 kg  BMI 18.77 kg/m2  SpO2 98% Physical Exam  Constitutional: He appears well-developed and well-nourished. No distress.  HENT:  Head: Normocephalic and atraumatic.  Mouth/Throat: Oropharynx is clear and moist. No oropharyngeal exudate.  Eyes: Conjunctivae are normal. Pupils are equal, round, and reactive to light. Right eye exhibits no discharge. Left eye exhibits no discharge. No scleral icterus.  Neck: Normal range of motion. Neck supple. No thyromegaly present.  Cardiovascular: Normal rate, regular rhythm, normal heart sounds and intact distal pulses.  Exam reveals no gallop and no friction rub.   No murmur heard. Pulmonary/Chest: Effort normal. No  stridor. No respiratory distress. He has wheezes (Mild in RLL). He has no rales.  Abdominal: Soft. Bowel sounds are normal. He exhibits no distension. There is no tenderness. There is no rebound and no guarding.  Musculoskeletal: He exhibits no edema.  Lymphadenopathy:    He has no cervical adenopathy.  Neurological:  He is alert. Coordination normal.  Skin: Skin is warm and dry. No rash noted. He is not diaphoretic. No pallor.  Psychiatric: He has a normal mood and affect.  Nursing note and vitals reviewed.   ED Course  Procedures (including critical care time) Labs Review Labs Reviewed - No data to display  Imaging Review Dg Chest 2 View  11/15/2015  CLINICAL DATA:  Short of breath.  Asthma EXAM: CHEST  2 VIEW COMPARISON:  06/27/2015 FINDINGS: The heart size and mediastinal contours are within normal limits. Both lungs are clear. The visualized skeletal structures are unremarkable. IMPRESSION: No active cardiopulmonary disease. Electronically Signed   By: Marlan Palau M.D.   On: 11/15/2015 07:01   I have personally reviewed and evaluated these images and lab results as part of my medical decision-making.   EKG Interpretation None      MDM   Patient with history of asthma that usually flares up during pollen season or when he gets a cold. Patient's breathing and wheezing improved after nebulizer treatment. Chest x-ray shows no active cardiopulmonary disease. Patient given prednisone in the ED. Discharged with prednisone 4 days and albuterol inhaler as needed. I referred the patient to [facility] pulmonology for follow-up. Patient agrees with and understands plan. Patient discharged in stable and satisfactory condition. Patient also discussed and evaluated by Dr. Blinda Leatherwood who is in agreement with plan.   Final diagnoses:  Asthma, unspecified asthma severity, uncomplicated  Dyspnea      Emi Holes, PA-C 11/15/15 0930  Gilda Crease, MD 11/15/15 2324

## 2015-11-15 NOTE — ED Provider Notes (Signed)
Patient presented to the ER with shortness of breath. Patient does have a history of asthma, not currently using an inhaler. He reports that he only gets symptoms when he gets a cold or is exposed to pollen. He thinks that the pollen is what caused his difficulty breathing today.  Face to face Exam: HEENT - PERRLA Lungs - CTAB Heart - RRR, no M/R/G Abd - S/NT/ND Neuro - alert, oriented x3  Plan: Presented with wheezing that cleared after nebulizer treatment. Chest x-ray clear. Patient will continue prednisone and albuterol as needed.  Gilda Creasehristopher J Milinda Sweeney, MD 11/15/15 [phone]

## 2015-11-15 NOTE — ED Notes (Signed)
The pt has asthyma and he has had sob for 2 days

## 2015-11-15 NOTE — Discharge Instructions (Signed)
Medications: Albuterol inhaler, prednisone  Treatment: Take prednisone as prescribed for 5 days. Use albuterol inhaler every 4-6 hours as needed for shortness of breath or wheezing.  Follow-up: You may follow-up with the pulmonologist as needed for your asthma maintenance. Please return to emergency department if you continued to have trouble breathing, you're not able to find relief with inhaler, or if you develop any new or concerning symptoms.   Asthma, Adult Asthma is a recurring condition in which the airways tighten and narrow. Asthma can make it difficult to breathe. It can cause coughing, wheezing, and shortness of breath. Asthma episodes, also called asthma attacks, range from minor to life-threatening. Asthma cannot be cured, but medicines and lifestyle changes can help control it. CAUSES Asthma is believed to be caused by inherited (genetic) and environmental factors, but its exact cause is unknown. Asthma may be triggered by allergens, lung infections, or irritants in the air. Asthma triggers are different for each person. Common triggers include:   Animal dander.  Dust mites.  Cockroaches.  Pollen from trees or grass.  Mold.  Smoke.  Air pollutants such as dust, household cleaners, hair sprays, aerosol sprays, paint fumes, strong chemicals, or strong odors.  Cold air, weather changes, and winds (which increase molds and pollens in the air).  Strong emotional expressions such as crying or laughing hard.  Stress.  Certain medicines (such as aspirin) or types of drugs (such as beta-blockers).  Sulfites in foods and drinks. Foods and drinks that may contain sulfites include dried fruit, potato chips, and sparkling grape juice.  Infections or inflammatory conditions such as the flu, a cold, or an inflammation of the nasal membranes (rhinitis).  Gastroesophageal reflux disease (GERD).  Exercise or strenuous activity. SYMPTOMS Symptoms may occur immediately after  asthma is triggered or many hours later. Symptoms include:  Wheezing.  Excessive nighttime or early morning coughing.  Frequent or severe coughing with a common cold.  Chest tightness.  Shortness of breath. DIAGNOSIS  The diagnosis of asthma is made by a review of your medical history and a physical exam. Tests may also be performed. These may include:  Lung function studies. These tests show how much air you breathe in and out.  Allergy tests.  Imaging tests such as X-rays. TREATMENT  Asthma cannot be cured, but it can usually be controlled. Treatment involves identifying and avoiding your asthma triggers. It also involves medicines. There are 2 classes of medicine used for asthma treatment:   Controller medicines. These prevent asthma symptoms from occurring. They are usually taken every day.  Reliever or rescue medicines. These quickly relieve asthma symptoms. They are used as needed and provide short-term relief. Your health care provider will help you create an asthma action plan. An asthma action plan is a written plan for managing and treating your asthma attacks. It includes a list of your asthma triggers and how they may be avoided. It also includes information on when medicines should be taken and when their dosage should be changed. An action plan may also involve the use of a device called a peak flow meter. A peak flow meter measures how well the lungs are working. It helps you monitor your condition. HOME CARE INSTRUCTIONS   Take medicines only as directed by your health care provider. Speak with your health care provider if you have questions about how or when to take the medicines.  Use a peak flow meter as directed by your health care provider. Record and keep track of  readings.  Understand and use the action plan to help minimize or stop an asthma attack without needing to seek medical care.  Control your home environment in the following ways to help prevent  asthma attacks:  Do not smoke. Avoid being exposed to secondhand smoke.  Change your heating and air conditioning filter regularly.  Limit your use of fireplaces and wood stoves.  Get rid of pests (such as roaches and mice) and their droppings.  Throw away plants if you see mold on them.  Clean your floors and dust regularly. Use unscented cleaning products.  Try to have someone else vacuum for you regularly. Stay out of rooms while they are being vacuumed and for a short while afterward. If you vacuum, use a dust mask from a hardware store, a double-layered or microfilter vacuum cleaner bag, or a vacuum cleaner with a HEPA filter.  Replace carpet with wood, tile, or vinyl flooring. Carpet can trap dander and dust.  Use allergy-proof pillows, mattress covers, and box spring covers.  Wash bed sheets and blankets every week in hot water and dry them in a dryer.  Use blankets that are made of polyester or cotton.  Clean bathrooms and kitchens with bleach. If possible, have someone repaint the walls in these rooms with mold-resistant paint. Keep out of the rooms that are being cleaned and painted.  Wash hands frequently. SEEK MEDICAL CARE IF:   You have wheezing, shortness of breath, or a cough even if taking medicine to prevent attacks.  The colored mucus you cough up (sputum) is thicker than usual.  Your sputum changes from clear or white to yellow, green, gray, or bloody.  You have any problems that may be related to the medicines you are taking (such as a rash, itching, swelling, or trouble breathing).  You are using a reliever medicine more than 2-3 times per week.  Your peak flow is still at 50-79% of your personal best after following your action plan for 1 hour.  You have a fever. SEEK IMMEDIATE MEDICAL CARE IF:   You seem to be getting worse and are unresponsive to treatment during an asthma attack.  You are short of breath even at rest.  You get short of breath  when doing very little physical activity.  You have difficulty eating, drinking, or talking due to asthma symptoms.  You develop chest pain.  You develop a fast heartbeat.  You have a bluish color to your lips or fingernails.  You are light-headed, dizzy, or faint.  Your peak flow is less than 50% of your personal best.   This information is not intended to replace advice given to you by your health care provider. Make sure you discuss any questions you have with your health care provider.   Document Released: 07/24/2005 Document Revised: 04/14/2015 Document Reviewed: 02/20/2013 Elsevier Interactive Patient Education [employer]2016 [employer] Inc[employer]

## 2015-12-21 IMAGING — CR DG CHEST 2V
2 series · 2 of 2 positions shown · non-contrast
Comparison: DG CHEST 2 VIEW dated 01/22/2013

CLINICAL DATA: Asthma, wheezing, shortness of breath.

EXAM:
CHEST  2 VIEW

[w chest pa]
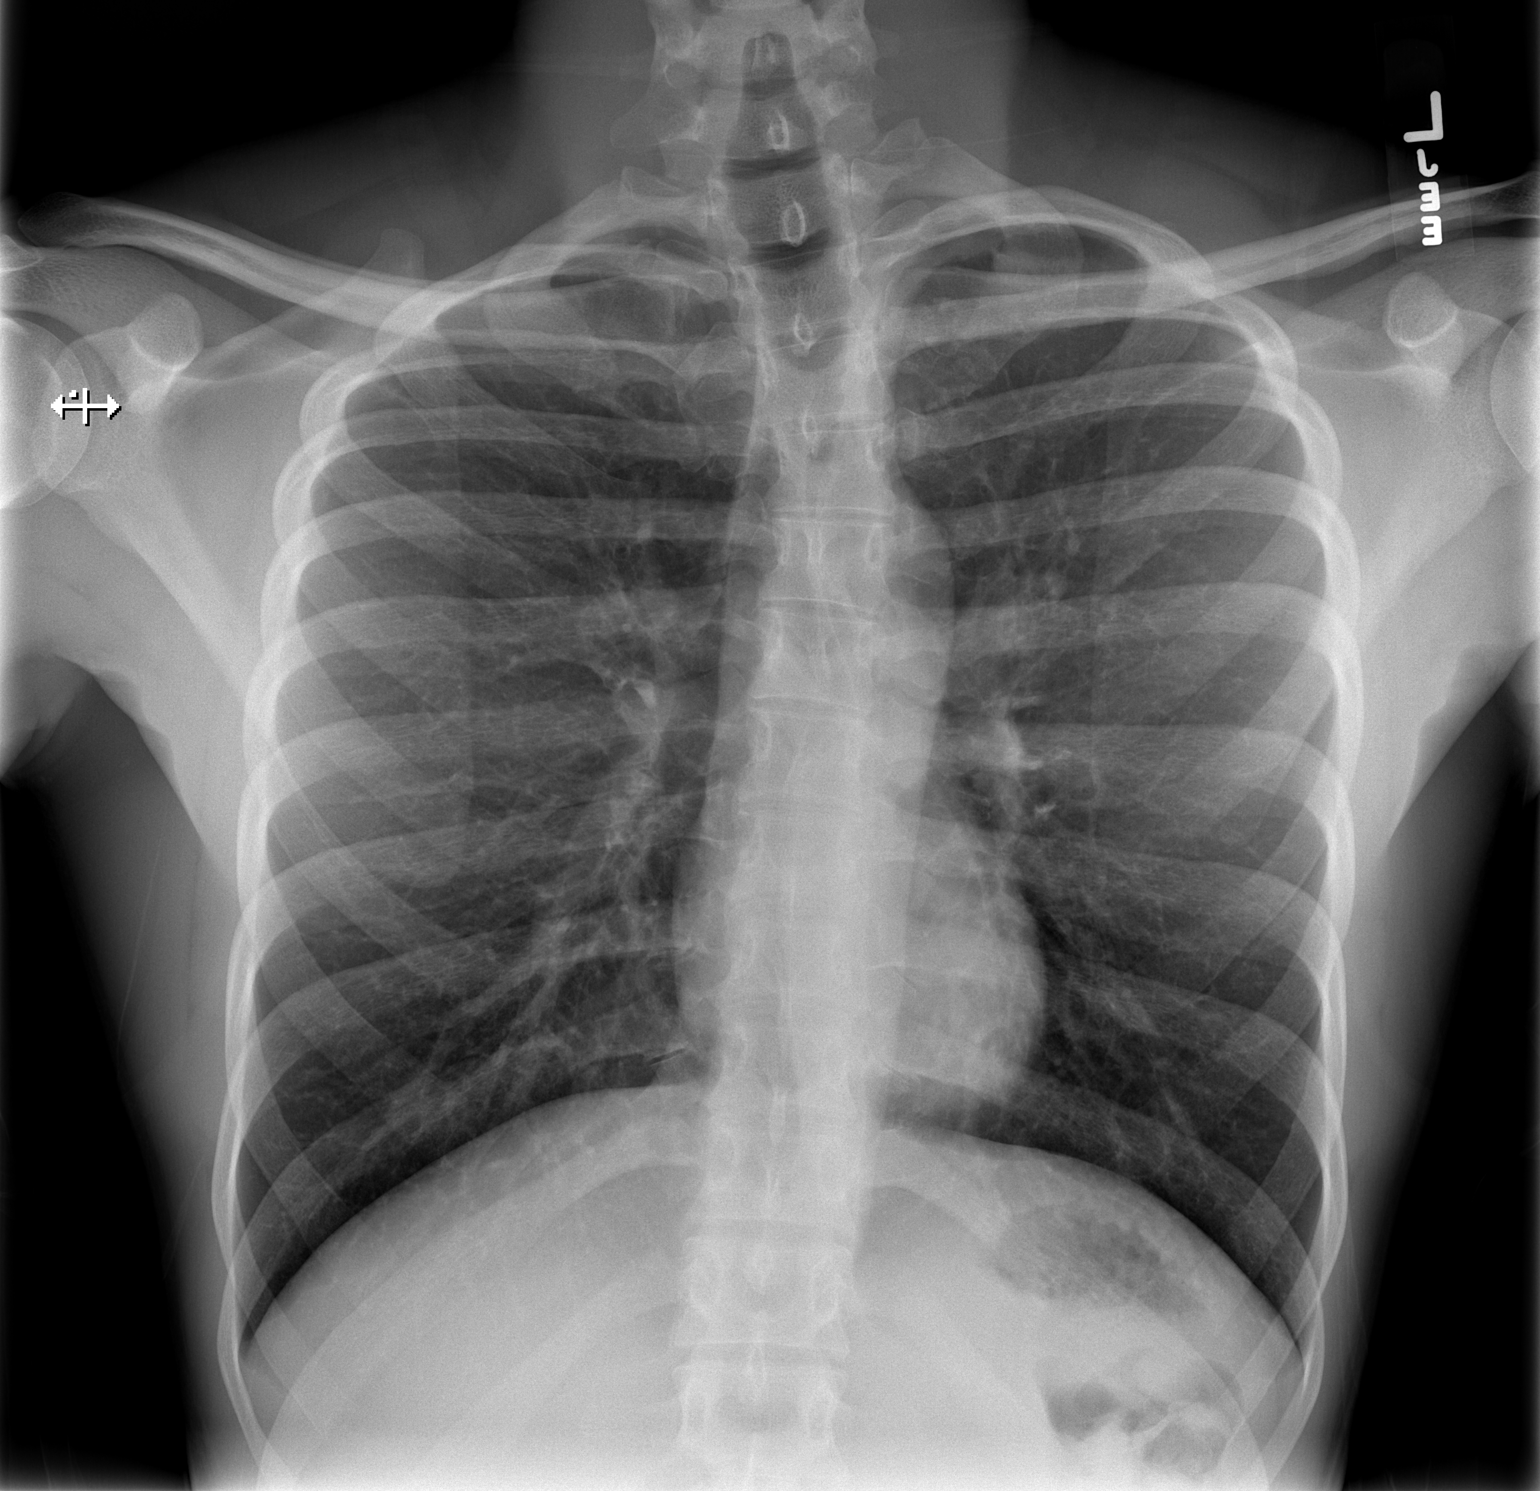

[w chest lat]
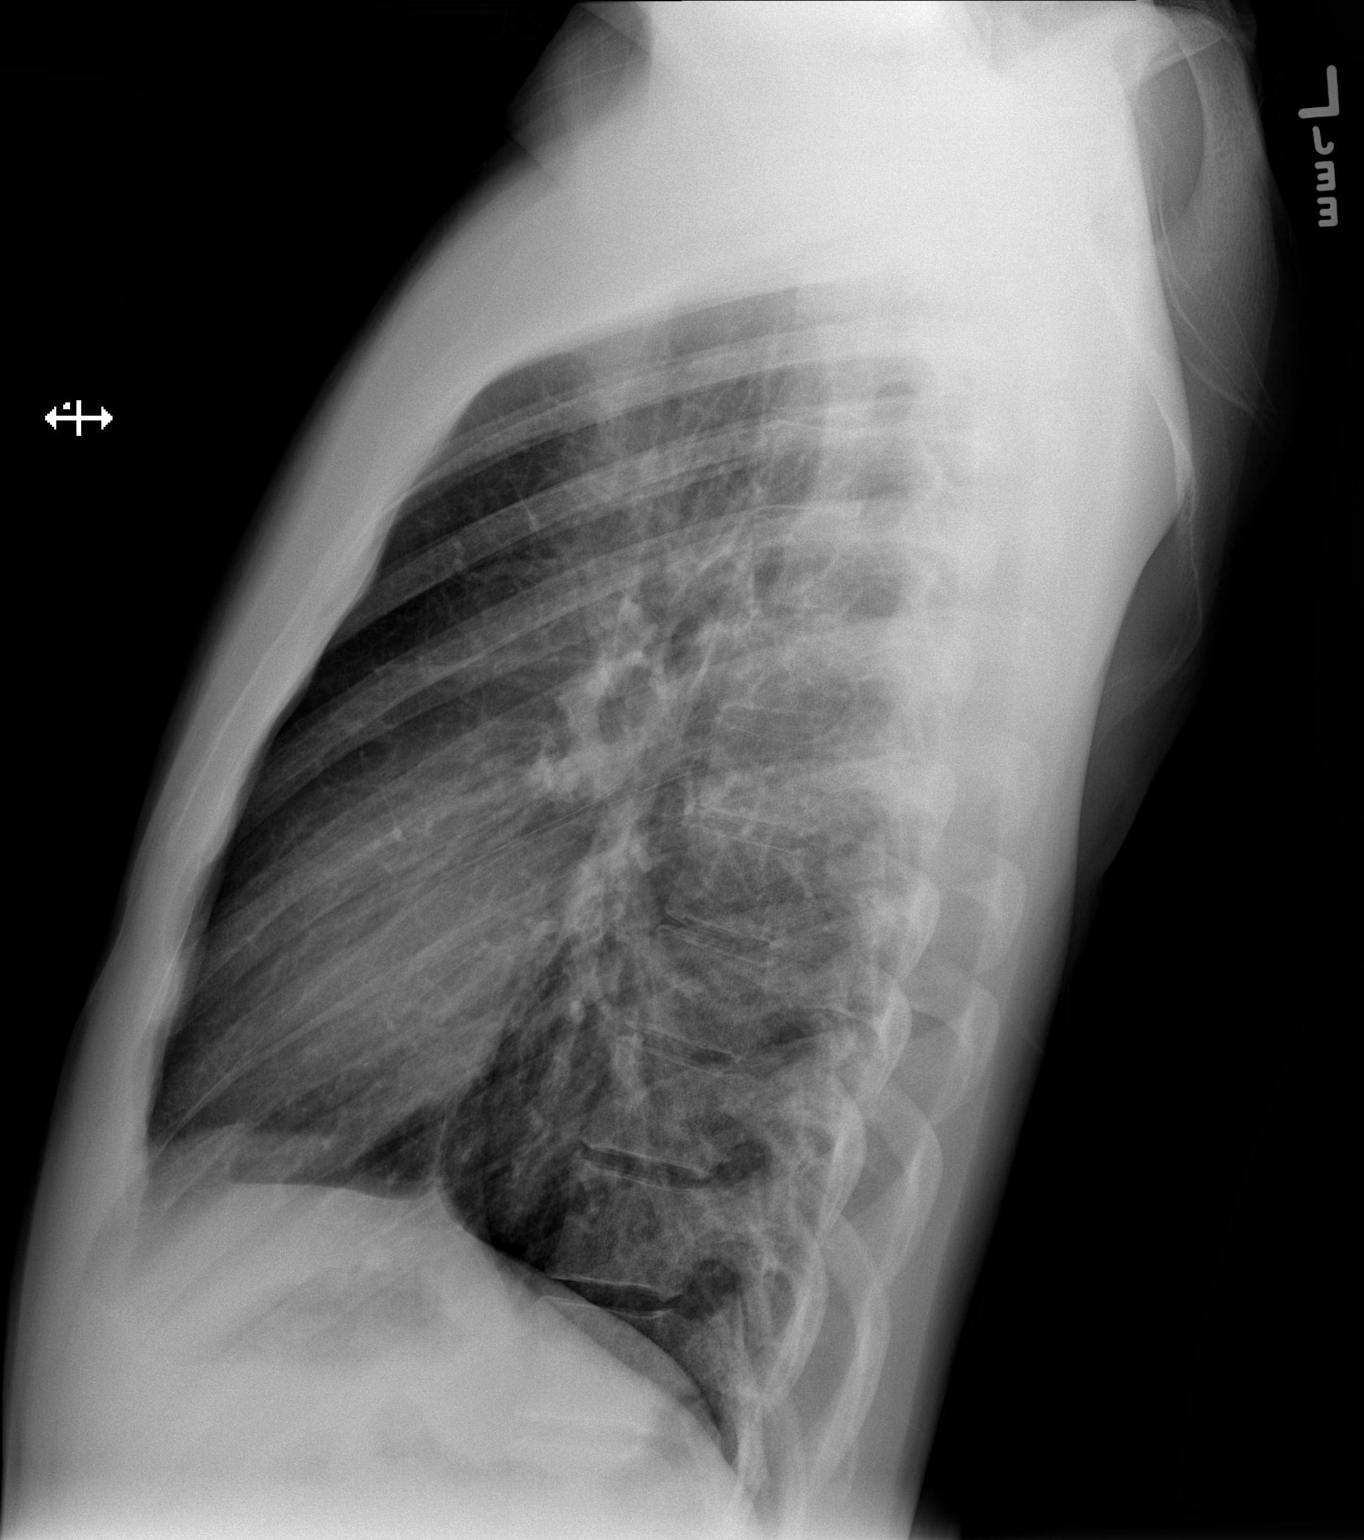

[2 of 2 positions shown; findings below may reference images not displayed]

FINDINGS: The cardiomediastinal silhouette is within normal limits. The lungs
are well inflated and clear. There is no pleural effusion or
pneumothorax. Mild levoscoliosis of the upper thoracic spine is
unchanged.
IMPRESSION: Clear lungs.

## 2015-12-22 ENCOUNTER — Encounter (HOSPITAL_COMMUNITY): Payer: Self-pay | Admitting: Family Medicine

## 2015-12-22 ENCOUNTER — Emergency Department (HOSPITAL_COMMUNITY)
Admission: EM | Admit: 2015-12-22 | Discharge: 2015-12-22 | Disposition: A | Discharge status: Home / Self Care | Payer: BC Managed Care – PPO | Attending: Emergency Medicine | Admitting: Emergency Medicine

## 2015-12-22 DIAGNOSIS — J45901 Unspecified asthma with (acute) exacerbation: Secondary | ICD-10-CM | POA: Insufficient documentation

## 2015-12-22 DIAGNOSIS — J302 Other seasonal allergic rhinitis: Secondary | ICD-10-CM | POA: Insufficient documentation

## 2015-12-22 DIAGNOSIS — R0602 Shortness of breath: Secondary | ICD-10-CM

## 2015-12-22 DIAGNOSIS — R062 Wheezing: Secondary | ICD-10-CM

## 2015-12-22 DIAGNOSIS — Z87891 Personal history of nicotine dependence: Secondary | ICD-10-CM | POA: Insufficient documentation

## 2015-12-22 DIAGNOSIS — Z79899 Other long term (current) drug therapy: Secondary | ICD-10-CM | POA: Insufficient documentation

## 2015-12-22 MED ORDER — IPRATROPIUM BROMIDE 0.02 % IN SOLN
0.5000 mg | Freq: Once | RESPIRATORY_TRACT | Status: AC
  Administered 2015-12-22: 0.5 mg via RESPIRATORY_TRACT
  Filled 2015-12-22: qty 2.5

## 2015-12-22 MED ORDER — PREDNISONE 20 MG PO TABS
60.0000 mg | ORAL_TABLET | Freq: Once | ORAL | Status: AC
  Administered 2015-12-22: 60 mg via ORAL
  Filled 2015-12-22: qty 3

## 2015-12-22 MED ORDER — IPRATROPIUM BROMIDE 0.02 % IN SOLN
RESPIRATORY_TRACT | Status: AC
  Filled 2015-12-22: qty 2.5

## 2015-12-22 MED ORDER — ALBUTEROL SULFATE (2.5 MG/3ML) 0.083% IN NEBU
5.0000 mg | INHALATION_SOLUTION | Freq: Once | RESPIRATORY_TRACT | Status: AC
  Administered 2015-12-22: 5 mg via RESPIRATORY_TRACT
  Filled 2015-12-22: qty 6

## 2015-12-22 MED ORDER — ALBUTEROL SULFATE (2.5 MG/3ML) 0.083% IN NEBU
INHALATION_SOLUTION | RESPIRATORY_TRACT | Status: AC
  Filled 2015-12-22: qty 6

## 2015-12-22 MED ORDER — PREDNISONE 20 MG PO TABS: ORAL_TABLET | ORAL | Status: DC

## 2015-12-22 MED ORDER — ALBUTEROL SULFATE HFA 108 (90 BASE) MCG/ACT IN AERS: 2.0000 | INHALATION_SPRAY | RESPIRATORY_TRACT | Status: DC | PRN

## 2015-12-22 MED ORDER — ALBUTEROL SULFATE (2.5 MG/3ML) 0.083% IN NEBU
5.0000 mg | INHALATION_SOLUTION | Freq: Once | RESPIRATORY_TRACT | Status: AC
Start: 2015-12-22 — End: 2015-12-22
  Administered 2015-12-22: 5 mg via RESPIRATORY_TRACT

## 2015-12-22 MED ORDER — ALBUTEROL SULFATE HFA 108 (90 BASE) MCG/ACT IN AERS
2.0000 | INHALATION_SPRAY | Freq: Once | RESPIRATORY_TRACT | Status: AC
  Administered 2015-12-22: 2 via RESPIRATORY_TRACT
  Filled 2015-12-22: qty 6.7

## 2015-12-22 NOTE — ED Notes (Signed)
EDP at bedside  

## 2015-12-22 NOTE — ED Notes (Signed)
Pt here for SOB and asthma that started after work today. Pt acute distress at triage, diaphoretic and using accessory muscles to breathe.

## 2015-12-22 NOTE — ED Provider Notes (Signed)
CSN: [id number]     Arrival date & time 12/22/15  1845 History  By signing my name below, I, Brett Gilbert, attest that this documentation has been prepared under the direction and in the presence of [address], [employer].   Electronically Signed: Iona Gilbert, ED Scribe 12/22/2015 at 7:32 PM.  Chief Complaint  Patient presents with  . Shortness of Breath  . Asthma    Patient is a 30 y.o. male presenting with shortness of breath and asthma. The history is provided by the patient and medical records. No language interpreter was used.  Shortness of Breath Severity:  Moderate Onset quality:  Gradual Duration:  3 hours Timing:  Constant Progression:  Resolved Chronicity:  Recurrent Context: pollens   Relieved by: albuterol neb tx here. Worsened by:  Activity (walking) Ineffective treatments:  Inhaler (home inhaler which was empty) Associated symptoms: wheezing   Associated symptoms: no abdominal pain, no chest pain, no cough, no diaphoresis, no fever, no hemoptysis, no rash, no sore throat, no sputum production and no vomiting   Risk factors: no family hx of DVT, no hx of PE/DVT, no prolonged immobilization and no recent surgery   Asthma Associated symptoms include shortness of breath. Pertinent negatives include no chest pain and no abdominal pain.   HPI Comments: Brett Gilbert is a 30 y.o. male with PMHx of asthma, who presents to the Emergency Department complaining of gradual onset shortness of breath, onset about 3 hours ago while at work. Pt reports associated wheezing and chest tightness. He was given a breathing treatment in the ED and does not currently have any symptoms. Pt reports his symptoms presented similarly to previous episodes of asthma. He tried to use his inhaler PTA but was out of medication so it didn't help. His shortness of breath was worsened with walking. States he works around [employer] and is allergic to these so he thinks this aggravated his asthma.  No recent sick contact noted. No other associated symptoms noted. No other worsening or alleviating factors noted. Pt denies chest pain, hemoptysis, URI symptoms, LE swelling, fever, chills, diaphoresis, light-headedness, recent travel, hx of DVT/PE, recent surgery or prolonged immobilization, abdominal pain, nausea, vomiting, constipation, diarrhea, rash, dysuria, hematuria, rash, sore throat, rhinorrhea, or any other pertinent symptoms. No recent cough.    Past Medical History  Diagnosis Date  . Asthma    Past Surgical History  Procedure Laterality Date  . Knee surgery     History reviewed. No pertinent family history. Social History  Substance Use Topics  . Smoking status: Former Smoker    Types: Cigarettes    Quit date: 01/06/2015  . Smokeless tobacco: None  . Alcohol Use: No    Review of Systems  Constitutional: Negative for fever, chills and diaphoresis.  HENT: Negative for rhinorrhea and sore throat.   Respiratory: Positive for chest tightness, shortness of breath and wheezing. Negative for cough, hemoptysis and sputum production.   Cardiovascular: Negative for chest pain and leg swelling.  Gastrointestinal: Negative for nausea, vomiting, abdominal pain, diarrhea and constipation.  Genitourinary: Negative for dysuria and hematuria.  Musculoskeletal: Negative for myalgias and arthralgias.  Skin: Negative for color change and rash.  Allergic/Immunologic: Positive for environmental allergies. Negative for immunocompromised state.  Neurological: Negative for weakness, light-headedness and numbness.  Psychiatric/Behavioral: Negative for confusion.   10 Systems reviewed and all are negative for acute change except as noted in the HPI.  Allergies  Review of patient's allergies indicates no known allergies.  Home Medications  Prior to Admission medications   Medication Sig Start Date End Date Taking? Authorizing Provider  albuterol (PROVENTIL HFA;VENTOLIN HFA) 108 (90 Base)  MCG/ACT inhaler Inhale 1-2 puffs into the lungs every 4 (four) hours as needed for wheezing or shortness of breath. 11/15/15   Emi HolesAlexandra M Law, PA-C  diphenhydrAMINE (BENADRYL) 25 mg capsule Take 25 mg by mouth every 6 (six) hours as needed for allergies.    Historical Provider, MD  predniSONE (DELTASONE) 20 MG tablet 3 tabs po day one, then 2 po daily x 4 days 11/15/15   Waylan BogaAlexandra M Law, PA-C   BP 118/74 mmHg  Pulse 84  Temp(Src) 97.9 F (36.6 C) (Oral)  Resp 18  Ht 6\' 2"  (1.88 m)  Wt 150 lb (68.04 kg)  BMI 19.25 kg/m2  SpO2 94% Physical Exam  Constitutional: He is oriented to person, place, and time. Vital signs are normal. He appears well-developed and well-nourished.  Non-toxic appearance. No distress.  Afebrile, nontoxic, NAD  HENT:  Head: Normocephalic and atraumatic.  Mouth/Throat: Oropharynx is clear and moist and mucous membranes are normal.  Eyes: Conjunctivae and EOM are normal. Right eye exhibits no discharge. Left eye exhibits no discharge.  Neck: Normal range of motion. Neck supple.  Cardiovascular: Normal rate, regular rhythm, normal heart sounds and intact distal pulses.  Exam reveals no gallop and no friction rub.   No murmur heard. RRR, nl s1/s2, no m/r/g, distal pulses intact, no pedal edema.   Pulmonary/Chest: Effort normal. No respiratory distress. He has no decreased breath sounds. He has wheezes. He has no rhonchi. He has no rales.  Diffuse wheezing in all lung fields, without rhonchi or rales, no hypoxia or increased WOB, speaking in full sentences, SpO2 94% on RA  Abdominal: Soft. Normal appearance and bowel sounds are normal. He exhibits no distension. There is no tenderness. There is no rigidity, no rebound, no guarding, no CVA tenderness, no tenderness at McBurney's point and negative Murphy's sign.  Musculoskeletal: Normal range of motion.  MAE x4 Strength and sensation grossly intact Distal pulses intact Gait steady No pedal edema, neg homan's bilaterally   Neurological: He is alert and oriented to person, place, and time. He has normal strength. No sensory deficit.  Skin: Skin is warm, dry and intact. No rash noted.  Psychiatric: He has a normal mood and affect.  Nursing note and vitals reviewed.   ED Course  Procedures (including critical care time) DIAGNOSTIC STUDIES: Oxygen Saturation is 94% on RA, adequate by my interpretation.    COORDINATION OF CARE: 7:32 PM Discussed treatment plan with pt at bedside and pt agreed to plan.  Labs Review Labs Reviewed - No data to display  Imaging Review No results found.   EKG Interpretation None      MDM   Final diagnoses:  Asthma exacerbation  Wheezing  SOB (shortness of breath)    30 y.o. male here with asthma exacerbation. Had chest tightness, SOB, wheezing starting after work ~2hrs PTA, had breathing tx upon arrival and these symptoms improved. States this feels like his asthma. On exam, wheezing without focal consolidation or rhonchi/rales, doubt need for CXR. Will repeat duoneb, give prednisone, and recheck after that. Likely home with prednisone and new inhaler. Will reassess shortly  8:20 PM Lung sounds somewhat improved after second neb, will attempt third neb tx and then ambulate to ensure pulse ox doesn't drop. Pt feels greatly improved at this time, denies any ongoing complaints. Will reassess after next neb tx, likely home  after  8:53 PM Lung sounds greatly improved. Will send down inhaler for him to physically go home with, and ambulate now while checking pulse ox. If he passes without desats, he will go home with albuterol and prednisone scripts. F/up with PCP in 1wk. Start antihistamines. Will reassess after he ambulates  9:06 PM Ambulatory without desats. Will send home with previously discussed plan. I explained the diagnosis and have given explicit precautions to return to the ER including for any other new or worsening symptoms. The patient understands and accepts  the medical plan as it's been dictated and I have answered their questions. Discharge instructions concerning home care and prescriptions have been given. The patient is STABLE and is discharged to home in good condition.   I personally performed the services described in this documentation, which was scribed in my presence. The recorded information has been reviewed and is accurate.   BP 123/84 mmHg  Pulse 92  Temp(Src) 98.3 F (36.8 C) (Oral)  Resp 20  Ht  (1.88 m)  Wt 68.04 kg  BMI 19.25 kg/m2  SpO2 96%  Meds ordered this encounter  Medications  . albuterol (PROVENTIL) (2.5 MG/3ML) 0.083% nebulizer solution    Sig:     Bast, Traci   : cabinet override  . albuterol (PROVENTIL) (2.5 MG/3ML) 0.083% nebulizer solution 5 mg    Sig:   . albuterol (PROVENTIL) (2.5 MG/3ML) 0.083% nebulizer solution 5 mg    Sig:   . ipratropium (ATROVENT) nebulizer solution 0.5 mg    Sig:   . predniSONE (DELTASONE) tablet 60 mg    Sig:   . ipratropium (ATROVENT) 0.02 % nebulizer solution    Sig:     [place]  : cabinet override  . albuterol (PROVENTIL) (2.5 MG/3ML) 0.083% nebulizer solution 5 mg    Sig:   . ipratropium (ATROVENT) nebulizer solution 0.5 mg    Sig:   . albuterol (PROVENTIL HFA;VENTOLIN HFA) 108 (90 Base) MCG/ACT inhaler    Sig: Inhale 2 puffs into the lungs every 4 (four) hours as needed for wheezing or shortness of breath (cough).    Dispense:  1 Inhaler    Refill:  0    Order Specific Question:  Supervising Provider    Answer:  MILLER, BRIAN [[id number]]  . predniSONE (DELTASONE) 20 MG tablet    Sig: 3 tabs po daily x 3 days beginning on 12/23/15    Dispense:  9 tablet    Refill:  0    Order Specific Question:  Supervising Provider    Answer:  Eber Hong [[id number]]  . albuterol (PROVENTIL HFA;VENTOLIN HFA) 108 (90 Base) MCG/ACT inhaler 2 puff    Sig:      Ramar Nobrega Camprubi-Soms, PA-C 12/22/15 2106  Zadie Rhine, MD 12/23/15 [phone]

## 2015-12-22 NOTE — Discharge Instructions (Signed)
Continue to stay well-hydrated. Use Mucinex for cough suppression/expectoration of mucus. Take over-the-counter Benadryl or other antihistamines (xyzal, claritin, zyrtec, etc) to help with your allergies. Use inhaler as directed, as needed for shortness of breath/chest tightness/wheezing. Take prednisone as directed, starting on 12/23/15 since you got your first dose here. Followup with your primary care doctor in 5-7 days for recheck of ongoing symptoms. Return to emergency department for emergent changing or worsening of symptoms.   Asthma, Acute Bronchospasm Acute bronchospasm caused by asthma is also referred to as an asthma attack. Bronchospasm means your air passages become narrowed. The narrowing is caused by inflammation and tightening of the muscles in the air tubes (bronchi) in your lungs. This can make it hard to breathe or cause you to wheeze and cough. CAUSES Possible triggers are: 1. Animal dander from the skin, hair, or feathers of animals. 2. Dust mites contained in house dust. 3. Cockroaches. 4. Pollen from trees or grass. 5. Mold. 6. Cigarette or tobacco smoke. 7. Air pollutants such as dust, household cleaners, hair sprays, aerosol sprays, paint fumes, strong chemicals, or strong odors. 8. Cold air or weather changes. Cold air may trigger inflammation. Winds increase molds and pollens in the air. 9. Strong emotions such as crying or laughing hard. 10. Stress. 11. Certain medicines such as aspirin or beta-blockers. 12. Sulfites in foods and drinks, such as dried fruits and wine. 13. Infections or inflammatory conditions, such as a flu, cold, or inflammation of the nasal membranes (rhinitis). 14. Gastroesophageal reflux disease (GERD). GERD is a condition where stomach acid backs up into your esophagus. 15. Exercise or strenuous activity. SIGNS AND SYMPTOMS   Wheezing.  Excessive coughing, particularly at night.  Chest tightness.  Shortness of breath. DIAGNOSIS  Your  health care provider will ask you about your medical history and perform a physical exam. A chest X-ray or blood testing may be performed to look for other causes of your symptoms or other conditions that may have triggered your asthma attack. TREATMENT  Treatment is aimed at reducing inflammation and opening up the airways in your lungs. Most asthma attacks are treated with inhaled medicines. These include quick relief or rescue medicines (such as bronchodilators) and controller medicines (such as inhaled corticosteroids). These medicines are sometimes given through an inhaler or a nebulizer. Systemic steroid medicine taken by mouth or given through an IV tube also can be used to reduce the inflammation when an attack is moderate or severe. Antibiotic medicines are only used if a bacterial infection is present.  HOME CARE INSTRUCTIONS   Rest.  Drink plenty of liquids. This helps the mucus to remain thin and be easily coughed up. Only use caffeine in moderation and do not use alcohol until you have recovered from your illness.  Do not smoke. Avoid being exposed to secondhand smoke.  You play a critical role in keeping yourself in good health. Avoid exposure to things that cause you to wheeze or to have breathing problems.  Keep your medicines up-to-date and available. Carefully follow your health care provider's treatment plan.  Take your medicine exactly as prescribed.  When pollen or pollution is bad, keep windows closed and use an air conditioner or go to places with air conditioning.  Asthma requires careful medical care. See your health care provider for a follow-up as advised. If you are more than [redacted] weeks pregnant and you were prescribed any new medicines, let your obstetrician know about the visit and how you are doing. Follow up  with your health care provider as directed.  After you have recovered from your asthma attack, make an appointment with your outpatient doctor to talk about  ways to reduce the likelihood of future attacks. If you do not have a doctor who manages your asthma, make an appointment with a primary care doctor to discuss your asthma. SEEK IMMEDIATE MEDICAL CARE IF:   You are getting worse.  You have trouble breathing. If severe, call your local emergency services (911 in the [place]  You develop chest pain or discomfort.  You are vomiting.  You are not able to keep fluids down.  You are coughing up yellow, green, brown, or bloody sputum.  You have a fever and your symptoms suddenly get worse.  You have trouble swallowing. MAKE SURE YOU:   Understand these instructions.  Will watch your condition.  Will get help right away if you are not doing well or get worse.   This information is not intended to replace advice given to you by your health care provider. Make sure you discuss any questions you have with your health care provider.   Document Released: 11/08/2006 Document Revised: 07/29/2013 Document Reviewed: 01/29/2013 Elsevier Interactive Patient Education 2016 [employer] Inc[employer]  Asthma Attack Prevention While you may not be able to control the fact that you have asthma, you can take actions to prevent asthma attacks. The best way to prevent asthma attacks is to maintain good control of your asthma. You can achieve this by: 16. Taking your medicines as directed. 17. Avoiding things that can irritate your airways or make your asthma symptoms worse (asthma triggers). 18. Keeping track of how well your asthma is controlled and of any changes in your symptoms. 19. Responding quickly to worsening asthma symptoms (asthma attack). 20. Seeking emergency care when it is needed. WHAT ARE SOME WAYS TO PREVENT AN ASTHMA ATTACK? Have a Plan Work with your health care provider to create a written plan for managing and treating your asthma attacks (asthma action plan). This plan includes:  A list of your asthma triggers and how you can avoid  them.  Information on when medicines should be taken and when their dosages should be changed.  The use of a device that measures how well your lungs are working (peak flow meter). Monitor Your Asthma Use your peak flow meter and record your results in a journal every day. A drop in your peak flow numbers on one or more days may indicate the start of an asthma attack. This can happen even before you start to feel symptoms. You can prevent an asthma attack from getting worse by following the steps in your asthma action plan. Avoid Asthma Triggers Work with your asthma health care provider to find out what your asthma triggers are. This can be done by:  Allergy testing.  Keeping a journal that notes when asthma attacks occur and the factors that may have contributed to them.  Determining if there are other medical conditions that are making your asthma worse. Once you have determined your asthma triggers, take steps to avoid them. This may include avoiding excessive or prolonged exposure to:  Dust. Have someone dust and vacuum your home for you once or twice a week. Using a high-efficiency particulate arrestance (HEPA) vacuum is best.  Smoke. This includes campfire smoke, forest fire smoke, and secondhand smoke from tobacco products.  Pet dander. Avoid contact with animals that you know you are allergic to.  Allergens from trees, grasses or pollens. Avoid  spending a lot of time outdoors when pollen counts are high, and on very windy days.  Very cold, dry, or humid air.  Mold.  Foods that contain high amounts of sulfites.  Strong odors.  Outdoor air pollutants, such as Museum/gallery exhibitions officer.  Indoor air pollutants, such as aerosol sprays and fumes from household cleaners.  Household pests, including dust mites and cockroaches, and pest droppings.  Certain medicines, including NSAIDs. Always talk to your health care provider before stopping or starting any new medicines. Medicines Take  over-the-counter and prescription medicines only as told by your health care provider. Many asthma attacks can be prevented by carefully following your medicine schedule. Taking your medicines correctly is especially important when you cannot avoid certain asthma triggers. Act Quickly If an asthma attack does happen, acting quickly can decrease how severe it is and how long it lasts. Take these steps:   Pay attention to your symptoms. If you are coughing, wheezing, or having difficulty breathing, do not wait to see if your symptoms go away on their own. Follow your asthma action plan.  If you have followed your asthma action plan and your symptoms are not improving, call your health care provider or seek immediate medical care at the nearest hospital. It is important to note how often you need to use your fast-acting rescue inhaler. If you are using your rescue inhaler more often, it may mean that your asthma is not under control. Adjusting your asthma treatment plan may help you to prevent future asthma attacks and help you to gain better control of your condition. HOW CAN I PREVENT AN ASTHMA ATTACK WHEN I EXERCISE? Follow advice from your health care provider about whether you should use your fast-acting inhaler before exercising. Many people with asthma experience exercise-induced bronchoconstriction (EIB). This condition often worsens during vigorous exercise in cold, humid, or dry environments. Usually, people with EIB can stay very active by pre-treating with a fast-acting inhaler before exercising.   This information is not intended to replace advice given to you by your health care provider. Make sure you discuss any questions you have with your health care provider.   Document Released: 07/12/2009 Document Revised: 04/14/2015 Document Reviewed: 12/24/2014 Elsevier Interactive Patient Education 2016 [employer].  How to Use an Inhaler Using your inhaler correctly is very important. Good  technique will make sure that the medicine reaches your lungs.  HOW TO USE AN INHALER: 21. Take the cap off the inhaler. 22. If this is the first time using your inhaler, you need to prime it. Shake the inhaler for 5 seconds. Release four puffs into the air, away from your face. Ask your doctor for help if you have questions. 23. Shake the inhaler for 5 seconds. 24. Turn the inhaler so the bottle is above the mouthpiece. 25. Put your pointer finger on top of the bottle. Your thumb holds the bottom of the inhaler. 26. Open your mouth. 27. Either hold the inhaler away from your mouth (the width of 2 fingers) or place your lips tightly around the mouthpiece. Ask your doctor which way to use your inhaler. 28. Breathe out as much air as possible. 29. Breathe in and push down on the bottle 1 time to release the medicine. You will feel the medicine go in your mouth and throat. 30. Continue to take a deep breath in very slowly. Try to fill your lungs. 31. After you have breathed in completely, hold your breath for 10 seconds. This will help the  medicine to settle in your lungs. If you cannot hold your breath for 10 seconds, hold it for as long as you can before you breathe out. 32. Breathe out slowly, through pursed lips. Whistling is an example of pursed lips. 33. If your doctor has told you to take more than 1 puff, wait at least 15-30 seconds between puffs. This will help you get the best results from your medicine. Do not use the inhaler more than your doctor tells you to. 34. Put the cap back on the inhaler. 35. Follow the directions from your doctor or from the inhaler package about cleaning the inhaler. If you use more than one inhaler, ask your doctor which inhalers to use and what order to use them in. Ask your doctor to help you figure out when you will need to refill your inhaler.  If you use a steroid inhaler, always rinse your mouth with water after your last puff, gargle and spit out the  water. Do not swallow the water. GET HELP IF:  The inhaler medicine only partially helps to stop wheezing or shortness of breath.  You are having trouble using your inhaler.  You have some increase in thick spit (phlegm). GET HELP RIGHT AWAY IF:  The inhaler medicine does not help your wheezing or shortness of breath or you have tightness in your chest.  You have dizziness, headaches, or fast heart rate.  You have chills, fever, or night sweats.  You have a large increase of thick spit, or your thick spit is bloody. MAKE SURE YOU:   Understand these instructions.  Will watch your condition.  Will get help right away if you are not doing well or get worse.   This information is not intended to replace advice given to you by your health care provider. Make sure you discuss any questions you have with your health care provider.   Document Released: 05/02/2008 Document Revised: 05/14/2013 Document Reviewed: 02/20/2013 Elsevier Interactive Patient Education [employer] Elsevier Inc.

## 2015-12-22 NOTE — ED Notes (Signed)
Pt ambulated with pulse ox, NAD noted. O2 sats remained 94-96% RA

## 2016-06-27 ENCOUNTER — Encounter (HOSPITAL_COMMUNITY): Payer: Self-pay | Admitting: Emergency Medicine

## 2016-06-27 ENCOUNTER — Emergency Department (HOSPITAL_COMMUNITY)
Admission: EM | Admit: 2016-06-27 | Discharge: 2016-06-27 | Disposition: A | Discharge status: Home / Self Care | Payer: BC Managed Care – PPO | Attending: Emergency Medicine | Admitting: Emergency Medicine

## 2016-06-27 DIAGNOSIS — J4541 Moderate persistent asthma with (acute) exacerbation: Secondary | ICD-10-CM | POA: Insufficient documentation

## 2016-06-27 DIAGNOSIS — Z87891 Personal history of nicotine dependence: Secondary | ICD-10-CM | POA: Insufficient documentation

## 2016-06-27 MED ORDER — ALBUTEROL SULFATE HFA 108 (90 BASE) MCG/ACT IN AERS
2.0000 | INHALATION_SPRAY | Freq: Once | RESPIRATORY_TRACT | Status: AC
  Administered 2016-06-27: 2 via RESPIRATORY_TRACT
  Filled 2016-06-27: qty 6.7

## 2016-06-27 MED ORDER — IPRATROPIUM-ALBUTEROL 0.5-2.5 (3) MG/3ML IN SOLN
3.0000 mL | Freq: Once | RESPIRATORY_TRACT | Status: AC
  Administered 2016-06-27: 3 mL via RESPIRATORY_TRACT
  Filled 2016-06-27: qty 3

## 2016-06-27 MED ORDER — PREDNISONE 20 MG PO TABS
40.0000 mg | ORAL_TABLET | Freq: Once | ORAL | Status: AC
  Administered 2016-06-27: 40 mg via ORAL
  Filled 2016-06-27: qty 2

## 2016-06-27 MED ORDER — PREDNISONE 10 MG PO TABS: 40.0000 mg | ORAL_TABLET | Freq: Every day | ORAL | 0 refills | Status: AC

## 2016-06-27 MED ORDER — IPRATROPIUM-ALBUTEROL 0.5-2.5 (3) MG/3ML IN SOLN
3.0000 mL | RESPIRATORY_TRACT | Status: AC
  Administered 2016-06-27 (×2): 3 mL via RESPIRATORY_TRACT
  Filled 2016-06-27: qty 6
  Filled 2016-06-27 (×2): qty 3

## 2016-06-27 NOTE — ED Triage Notes (Signed)
Pt here with asthma sx worse this am; pt sts out of home meds x 2 weeks

## 2016-06-27 NOTE — ED Provider Notes (Signed)
[facility]-EMERGENCY DEPT Provider Note   CSN: [id number] Arrival date & time: 06/27/16  [id number]     History   Chief Complaint Chief Complaint  Patient presents with  . Asthma    HPI Brett Gilbert is a 30 y.o. male.  The history is provided by the patient.  Asthma  This is a chronic problem. The current episode started 6 to 12 hours ago. The problem occurs constantly. The problem has not changed since onset.Associated symptoms include shortness of breath. Pertinent negatives include no chest pain, no abdominal pain and no headaches. Nothing aggravates the symptoms. Nothing relieves the symptoms. He has tried nothing for the symptoms.   Ran out of his albuterol 2 weeks ago.   Feels like his typical asthma exacerbation with chest and back tightness.   Past Medical History:  Diagnosis Date  . Asthma     There are no active problems to display for this patient.   Past Surgical History:  Procedure Laterality Date  . KNEE SURGERY         Home Medications    Prior to Admission medications   Medication Sig Start Date End Date Taking? Authorizing Provider  albuterol (PROVENTIL HFA;VENTOLIN HFA) 108 (90 Base) MCG/ACT inhaler Inhale 1-2 puffs into the lungs every 4 (four) hours as needed for wheezing or shortness of breath. Patient not taking: Reported on 06/27/2016 11/15/15   Waylan BogaAlexandra M Law, PA-C  predniSONE (DELTASONE) 10 MG tablet Take 4 tablets (40 mg total) by mouth daily. 06/28/16 07/02/16  Nira ConnPedro Eduardo Cardama, MD    Family History History reviewed. No pertinent family history.  Social History Social History  Substance Use Topics  . Smoking status: Former Smoker    Types: Cigarettes    Quit date: 01/06/2015  . Smokeless tobacco: Not on file  . Alcohol use No     Allergies   Patient has no known allergies.   Review of Systems Review of Systems  Constitutional: Negative for fever.  HENT: Positive for congestion (chronic).   Respiratory: Positive for  shortness of breath.   Cardiovascular: Negative for chest pain.  Gastrointestinal: Negative for abdominal pain.  Neurological: Negative for headaches.  Ten systems are reviewed and are negative for acute change except as noted in the HPI    Physical Exam Updated Vital Signs BP (!) 158/101 (BP Location: Right Arm)   Pulse 101   Temp 97.3 F (36.3 C) (Oral)   Resp (!) 33   Ht 6\' 3"  (1.905 m)   Wt 155 lb (70.3 kg)   SpO2 95%   BMI 19.37 kg/m   Physical Exam  Constitutional: He is oriented to person, place, and time. He appears well-developed and well-nourished. No distress.  HENT:  Head: Normocephalic and atraumatic.  Nose: Nose normal.  Eyes: Conjunctivae and EOM are normal. Pupils are equal, round, and reactive to light. Right eye exhibits no discharge. Left eye exhibits no discharge. No scleral icterus.  Neck: Normal range of motion. Neck supple.  Cardiovascular: Normal rate and regular rhythm.  Exam reveals no gallop and no friction rub.   No murmur heard. Pulmonary/Chest: Effort normal. No stridor. Tachypnea noted. No respiratory distress. He has decreased breath sounds in the right lower field and the left lower field. He has wheezes in the right upper field, the right middle field, the left upper field and the left middle field. He has no rales.  Abdominal: Soft. He exhibits no distension. There is no tenderness.  Musculoskeletal: He exhibits no edema  or tenderness.  Neurological: He is alert and oriented to person, place, and time.  Skin: Skin is warm and dry. No rash noted. He is not diaphoretic. No erythema.  Psychiatric: He has a normal mood and affect.  Vitals reviewed.    ED Treatments / Results  Labs (all labs ordered are listed, but only abnormal results are displayed) Labs Reviewed - No data to display  EKG  EKG Interpretation None       Radiology No results found.  Procedures Procedures (including critical care time)  Medications Ordered in  ED Medications  ipratropium-albuterol (DUONEB) 0.5-2.5 (3) MG/3ML nebulizer solution 3 mL (3 mLs Nebulization Not Given 06/27/16 0908)  ipratropium-albuterol (DUONEB) 0.5-2.5 (3) MG/3ML nebulizer solution 3 mL (3 mLs Nebulization Given 06/27/16 0829)  predniSONE (DELTASONE) tablet 40 mg (40 mg Oral Given 06/27/16 0901)  albuterol (PROVENTIL HFA;VENTOLIN HFA) 108 (90 Base) MCG/ACT inhaler 2 puff (2 puffs Inhalation Given 06/27/16 0902)     Initial Impression / Assessment and Plan / ED Course  I have reviewed the triage vital signs and the nursing notes.  Pertinent labs & imaging results that were available during my care of the patient were reviewed by me and considered in my medical decision making (see chart for details).  Clinical Course     Asthma exacerbation. No evidence of superimposed infection on history. Significant improvement in symptomatology following breathing treatment and steroids. Patient provided with albuterol inhaler and prescription for prednisone.  CV discharge with strict return precautions.  Final Clinical Impressions(s) / ED Diagnoses   Final diagnoses:  Moderate persistent asthma with exacerbation   Disposition: Discharge  Condition: Good  I have discussed the results, Dx and Tx plan with the patient who expressed understanding and agree(s) with the plan. Discharge instructions discussed at great length. The patient was given strict return precautions who verbalized understanding of the instructions. No further questions at time of discharge.    New Prescriptions   PREDNISONE (DELTASONE) 10 MG TABLET    Take 4 tablets (40 mg total) by mouth daily.    Follow Up[facility] [address] [place] [id number] [phone] Call  For help establishing care with a care provider      Nira ConnPedro Eduardo Cardama, MD 06/27/16 1017

## 2016-08-26 ENCOUNTER — Emergency Department (HOSPITAL_COMMUNITY)
Admission: EM | Admit: 2016-08-26 | Discharge: 2016-08-26 | Disposition: A | Discharge status: Home / Self Care | Payer: BC Managed Care – PPO | Attending: Emergency Medicine | Admitting: Emergency Medicine

## 2016-08-26 ENCOUNTER — Encounter (HOSPITAL_COMMUNITY): Payer: Self-pay | Admitting: Emergency Medicine

## 2016-08-26 DIAGNOSIS — J4521 Mild intermittent asthma with (acute) exacerbation: Secondary | ICD-10-CM

## 2016-08-26 DIAGNOSIS — Z87891 Personal history of nicotine dependence: Secondary | ICD-10-CM | POA: Insufficient documentation

## 2016-08-26 DIAGNOSIS — J45901 Unspecified asthma with (acute) exacerbation: Secondary | ICD-10-CM | POA: Insufficient documentation

## 2016-08-26 DIAGNOSIS — Z79899 Other long term (current) drug therapy: Secondary | ICD-10-CM | POA: Insufficient documentation

## 2016-08-26 MED ORDER — ALBUTEROL SULFATE HFA 108 (90 BASE) MCG/ACT IN AERS: 2.0000 | INHALATION_SPRAY | RESPIRATORY_TRACT | 0 refills | Status: DC | PRN

## 2016-08-26 MED ORDER — PREDNISONE 20 MG PO TABS
60.0000 mg | ORAL_TABLET | Freq: Once | ORAL | Status: AC
  Administered 2016-08-26: 60 mg via ORAL
  Filled 2016-08-26: qty 3

## 2016-08-26 MED ORDER — IPRATROPIUM BROMIDE 0.02 % IN SOLN
RESPIRATORY_TRACT | Status: AC
  Administered 2016-08-26: 0.5 mg via RESPIRATORY_TRACT
  Filled 2016-08-26: qty 2.5

## 2016-08-26 MED ORDER — IPRATROPIUM BROMIDE 0.02 % IN SOLN
0.5000 mg | Freq: Once | RESPIRATORY_TRACT | Status: AC
  Administered 2016-08-26: 0.5 mg via RESPIRATORY_TRACT

## 2016-08-26 MED ORDER — PREDNISONE 20 MG PO TABS: ORAL_TABLET | ORAL | 0 refills | Status: DC

## 2016-08-26 MED ORDER — ALBUTEROL (5 MG/ML) CONTINUOUS INHALATION SOLN
10.0000 mg/h | INHALATION_SOLUTION | RESPIRATORY_TRACT | Status: DC
  Administered 2016-08-26: 10 mg/h via RESPIRATORY_TRACT

## 2016-08-26 MED ORDER — ALBUTEROL (5 MG/ML) CONTINUOUS INHALATION SOLN
INHALATION_SOLUTION | RESPIRATORY_TRACT | Status: AC
  Administered 2016-08-26: 10 mg/h via RESPIRATORY_TRACT
  Filled 2016-08-26: qty 20

## 2016-08-26 MED ORDER — ALBUTEROL SULFATE (2.5 MG/3ML) 0.083% IN NEBU
5.0000 mg | INHALATION_SOLUTION | Freq: Once | RESPIRATORY_TRACT | Status: AC
  Administered 2016-08-26: 5 mg via RESPIRATORY_TRACT

## 2016-08-26 NOTE — ED Notes (Signed)
Pt ambulated unassisted with ease. O2 sat at or above 97%

## 2016-08-26 NOTE — ED Triage Notes (Signed)
Pt presents to ED very short of breath, unable to speak in full sentences.  Pt with a history of asthma, states he is out of his inhaler.  Wheezes heard on assessment, pt put on albuterol nebulizer.  Respiratory called.

## 2016-08-26 NOTE — ED Provider Notes (Signed)
[facility]EM[facility]Y DEPT Provider Note   CSN: [id number] Arrival date & time: 08/26/16  4098  By signing my name below, I, Brett Gilbert, attest that this documentation has been prepared under the direction and in the presence of Devoria Albe, MD . Electronically Signed: Modena Gilbert, Scribe. 08/26/2016. 3:55 AM.  Time seen 03:55 AM  History   Chief Complaint Chief Complaint  Patient presents with  . Shortness of Breath  . Asthma   The history is provided by the patient. No language interpreter was used.   HPI Comments: Brett Gilbert is a 31 y.o. male with a PMHx of asthma who presents to the Emergency Department complaining of intermittent moderate SOB that started at 6 pm tonight.Brett Gilbert He is currently out of his albuterol inhaler. He admits being hospitalized for asthma exacerbation several times in 2017 and few before that. He has been treated with prednisone in the past when he has a flare up. He reports associated rhinorrhea and wheezing. He denies any fever, sore throat or other complaints.   PCP none  Past Medical History:  Diagnosis Date  . Asthma     There are no active problems to display for this patient.   Past Surgical History:  Procedure Laterality Date  . KNEE SURGERY         Home Medications    Prior to Admission medications   Medication Sig Start Date End Date Taking? Authorizing Provider  albuterol (PROVENTIL HFA;VENTOLIN HFA) 108 (90 Base) MCG/ACT inhaler Inhale 2 puffs into the lungs every 4 (four) hours as needed for wheezing or shortness of breath. 08/26/16   Devoria Albe, MD  predniSONE (DELTASONE) 20 MG tablet Take 3 po QD x 3d , then 2 po QD x 3d then 1 po QD x 3d 08/26/16   Devoria Albe, MD    Family History History reviewed. No pertinent family history.  Social History Social History  Substance Use Topics  . Smoking status: Former Smoker    Types: Cigarettes    Quit date: 01/06/2015  . Smokeless tobacco: Never Used  . Alcohol use No  employed as a  comedian   Allergies   Patient has no known allergies.   Review of Systems Review of Systems  All other systems reviewed and are negative.  A complete 10 system review of systems was obtained and all systems are negative except as noted in the HPI and PMH.   Physical Exam Updated Vital Signs BP (!) 152/127 (BP Location: Right Arm)   Pulse 119   Resp (!) 28   SpO2 96%   Vital signs normal except hypertension and tachycardia with tachypnea   Physical Exam  Constitutional: He is oriented to person, place, and time. He appears well-developed and well-nourished.  Non-toxic appearance. He does not appear ill. No distress.  He had had one nebulizer tx before my exam  HENT:  Head: Normocephalic and atraumatic.  Right Ear: External ear normal.  Left Ear: External ear normal.  Nose: Nose normal. No mucosal edema or rhinorrhea.  Mouth/Throat: Oropharynx is clear and moist and mucous membranes are normal. No dental abscesses or uvula swelling.  Eyes: Conjunctivae and EOM are normal. Pupils are equal, round, and reactive to light.  Neck: Normal range of motion and full passive range of motion without pain. Neck supple.  Cardiovascular: Normal rate, regular rhythm and normal heart sounds.  Exam reveals no gallop and no friction rub.   No murmur heard. Pulmonary/Chest: Effort normal. No respiratory distress. He has  decreased breath sounds. He has wheezes. He has no rhonchi. He has no rales. He exhibits no tenderness and no crepitus.  Dimished breathing. High-pitched diffuse breathing.   Abdominal: Soft. Normal appearance and bowel sounds are normal. He exhibits no distension. There is no tenderness. There is no rebound and no guarding.  Musculoskeletal: Normal range of motion. He exhibits no edema or tenderness.  Moves all extremities well.   Neurological: He is alert and oriented to person, place, and time. He has normal strength. No cranial nerve deficit.  Skin: Skin is warm, dry and  intact. No rash noted. No erythema. No pallor.  Psychiatric: He has a normal mood and affect. His speech is normal and behavior is normal. His mood appears not anxious.  Nursing note and vitals reviewed.    ED Treatments / Results   Procedures Procedures (including critical care time)  Medications Ordered in ED Medications  albuterol (PROVENTIL,VENTOLIN) solution continuous neb (0 mg/hr Nebulization Stopped 08/26/16 0615)  albuterol (PROVENTIL) (2.5 MG/3ML) 0.083% nebulizer solution 5 mg (5 mg Nebulization Given 08/26/16 0305)  ipratropium (ATROVENT) nebulizer solution 0.5 mg (0.5 mg Nebulization Given 08/26/16 0351)  predniSONE (DELTASONE) tablet 60 mg (60 mg Oral Given 08/26/16 0410)     Initial Impression / Assessment and Plan / ED Course  I have reviewed the triage vital signs and the nursing notes.  Pertinent labs & imaging results that were available during my care of the patient were reviewed by me and considered in my medical decision making (see chart for details).    DIAGNOSTIC STUDIES: Oxygen Saturation is 96% on RA, normal by my interpretation.    COORDINATION OF CARE: 3:59 AM- Pt advised of plan for treatment and pt agrees. Pt was given a continuous nebulizer and started on prednisone.   Recheck at 06:45 patient is feeling back to normal. Lungs are now clear. Pt was ambulated by nursing staff and his pulse ox remained above 97 % on RA.   Final Clinical Impressions(s) / ED Diagnoses   Final diagnoses:  Exacerbation of intermittent asthma, unspecified asthma severity    New Prescriptions New Prescriptions   ALBUTEROL (PROVENTIL HFA;VENTOLIN HFA) 108 (90 BASE) MCG/ACT INHALER    Inhale 2 puffs into the lungs every 4 (four) hours as needed for wheezing or shortness of breath.   PREDNISONE (DELTASONE) 20 MG TABLET    Take 3 po QD x 3d , then 2 po QD x 3d then 1 po QD x 3d   Plan discharge  Devoria AlbeIva Katharina Jehle, MD, FACEP  I personally performed the services described in this  documentation, which was scribed in my presence. The recorded information has been reviewed and considered.  Devoria AlbeIva Tona Qualley, MD, Concha PyoFACEP     Gaberial Cada, MD 08/26/16 [phone]

## 2016-08-26 NOTE — ED Notes (Signed)
Pt states he understands instructions. Home stable with family. All questions answered.Steady gait.

## 2016-08-26 NOTE — Discharge Instructions (Signed)
Use the inhaler as needed for shortness of breath or wheezing. Take the prednisone until gone.  Recheck if you get a fever, or feel worse. Again.

## 2016-10-31 ENCOUNTER — Emergency Department (HOSPITAL_COMMUNITY)
Admission: EM | Admit: 2016-10-31 | Discharge: 2016-10-31 | Disposition: A | Discharge status: Home / Self Care | Payer: Self-pay | Attending: Emergency Medicine | Admitting: Emergency Medicine

## 2016-10-31 ENCOUNTER — Encounter (HOSPITAL_COMMUNITY): Payer: Self-pay | Admitting: *Deleted

## 2016-10-31 ENCOUNTER — Emergency Department (HOSPITAL_COMMUNITY): Payer: Self-pay

## 2016-10-31 DIAGNOSIS — J45901 Unspecified asthma with (acute) exacerbation: Secondary | ICD-10-CM

## 2016-10-31 DIAGNOSIS — R062 Wheezing: Secondary | ICD-10-CM

## 2016-10-31 DIAGNOSIS — J4521 Mild intermittent asthma with (acute) exacerbation: Secondary | ICD-10-CM | POA: Insufficient documentation

## 2016-10-31 DIAGNOSIS — Z87891 Personal history of nicotine dependence: Secondary | ICD-10-CM | POA: Insufficient documentation

## 2016-10-31 MED ORDER — IPRATROPIUM-ALBUTEROL 0.5-2.5 (3) MG/3ML IN SOLN
3.0000 mL | Freq: Once | RESPIRATORY_TRACT | Status: AC
  Administered 2016-10-31: 3 mL via RESPIRATORY_TRACT
  Filled 2016-10-31: qty 3

## 2016-10-31 MED ORDER — ALBUTEROL SULFATE (2.5 MG/3ML) 0.083% IN NEBU
INHALATION_SOLUTION | RESPIRATORY_TRACT | Status: AC
  Filled 2016-10-31: qty 6

## 2016-10-31 MED ORDER — IPRATROPIUM-ALBUTEROL 0.5-2.5 (3) MG/3ML IN SOLN
3.0000 mL | Freq: Once | RESPIRATORY_TRACT | Status: AC
  Administered 2016-10-31: 3 mL via RESPIRATORY_TRACT

## 2016-10-31 MED ORDER — ALBUTEROL SULFATE (2.5 MG/3ML) 0.083% IN NEBU
2.5000 mg | INHALATION_SOLUTION | Freq: Once | RESPIRATORY_TRACT | Status: AC
  Administered 2016-10-31: 2.5 mg via RESPIRATORY_TRACT

## 2016-10-31 MED ORDER — ALBUTEROL SULFATE HFA 108 (90 BASE) MCG/ACT IN AERS
2.0000 | INHALATION_SPRAY | Freq: Once | RESPIRATORY_TRACT | Status: AC
  Administered 2016-10-31: 2 via RESPIRATORY_TRACT
  Filled 2016-10-31: qty 6.7

## 2016-10-31 MED ORDER — PREDNISONE 20 MG PO TABS
60.0000 mg | ORAL_TABLET | Freq: Once | ORAL | Status: AC
  Administered 2016-10-31: 60 mg via ORAL
  Filled 2016-10-31: qty 3

## 2016-10-31 MED ORDER — PREDNISONE 20 MG PO TABS: 60.0000 mg | ORAL_TABLET | Freq: Every day | ORAL | 0 refills | Status: AC

## 2016-10-31 NOTE — Discharge Instructions (Signed)
Please schedule an appointment with a primary care physician for ongoing management of your asthma. We did not find evidence of infection or pneumonia today. If any symptoms worsen or do not get better, please return to the nearest emergency department. Please schedule a follow-up appointment.

## 2016-10-31 NOTE — ED Triage Notes (Signed)
Pt in c/o increased shortness of breath since last night, pt has a history of asthma and feels like this is the same, pt reports cough that is new, pt speaking in short phrases in triage, breathing treatment initiated

## 2016-10-31 NOTE — ED Provider Notes (Signed)
[facility] Provider Note   CSN: [id number] Arrival date & time: 10/31/16  1547  By signing my name below, I, Marnette Burgessyan Andrew Long, attest that this documentation has been prepared under the direction and in the presence of Heide Scaleshristopher J Tegeler, MD. Electronically Signed: Marnette Burgessyan Andrew Long, Scribe. 10/31/2016. 5:30 PM.  History   Chief Complaint Chief Complaint  Patient presents with  . Asthma   The history is provided by the patient and medical records. No language interpreter was used.  Asthma  This is a chronic problem. The current episode started 12 to 24 hours ago. The problem has been gradually worsening. Associated symptoms include shortness of breath. Pertinent negatives include no chest pain, no abdominal pain and no headaches. The symptoms are relieved by medications.    HPI Comments:  Brett Gilbert is a 31 y.o. male with a PMHx of Asthma, who presents to the Emergency Department complaining of gradually worsening SOB onset last night. He reports a past h/o asthma exacerbation that his SOB today feels similar to. Pt felt mildly SOB when going to bed last night and when he awoke this morning the SOB had moderately worsened. Per chart review, pt has had five such exacerbations in the past year and a half which he was given Prednisone for with moderate relief of his symptoms. Today, pt has associated symptoms of a cough, baseline rhinorrhea, and wheezing. He tried his fast acting inhaler at home with minimal relief of his symptoms. No sick contact with similar symptoms. Pt denies congestion, fever, chills, abdominal pain, nausea, vomiting, diarrhea, constipation, chest pain, leg swelling, sore throat, and any other complaints at this time.   Past Medical History:  Diagnosis Date  . Asthma    There are no active problems to display for this patient.  Past Surgical History:  Procedure Laterality Date  . KNEE SURGERY      Home Medications    Prior to Admission medications    Medication Sig Start Date End Date Taking? Authorizing Provider  albuterol (PROVENTIL HFA;VENTOLIN HFA) 108 (90 Base) MCG/ACT inhaler Inhale 2 puffs into the lungs every 4 (four) hours as needed for wheezing or shortness of breath. 08/26/16   Devoria AlbeIva Knapp, MD  predniSONE (DELTASONE) 20 MG tablet Take 3 po QD x 3d , then 2 po QD x 3d then 1 po QD x 3d 08/26/16   Devoria AlbeIva Knapp, MD    Family History History reviewed. No pertinent family history.  Social History Social History  Substance Use Topics  . Smoking status: Former Smoker    Types: Cigarettes    Quit date: 01/06/2015  . Smokeless tobacco: Never Used  . Alcohol use No     Allergies   Patient has no known allergies.   Review of Systems Review of Systems  Constitutional: Negative for activity change, chills, diaphoresis, fatigue and fever.  HENT: Positive for rhinorrhea. Negative for congestion.   Eyes: Negative for visual disturbance.  Respiratory: Positive for cough, shortness of breath and wheezing. Negative for chest tightness and stridor.   Cardiovascular: Negative for chest pain, palpitations and leg swelling.  Gastrointestinal: Negative for abdominal distention, abdominal pain, blood in stool, constipation, diarrhea, nausea and vomiting.  Genitourinary: Negative for difficulty urinating, dysuria and flank pain.  Musculoskeletal: Negative for back pain and gait problem.  Skin: Negative for rash and wound.  Neurological: Negative for dizziness, weakness, light-headedness and headaches.  Psychiatric/Behavioral: Negative for agitation.  All other systems reviewed and are negative.    Physical  Exam Updated Vital Signs BP (!) 139/94 (BP Location: Right Arm)   Pulse 90   Temp 98.2 F (36.8 C) (Oral)   Resp (!) 22   SpO2 100%   Physical Exam  Constitutional: He is oriented to person, place, and time. He appears well-developed and well-nourished. No distress.  HENT:  Head: Normocephalic and atraumatic.  Right Ear:  External ear normal.  Left Ear: External ear normal.  Nose: Rhinorrhea (mild) present.  Mouth/Throat: Oropharynx is clear and moist. No oropharyngeal exudate.  Eyes: Conjunctivae and EOM are normal. Pupils are equal, round, and reactive to light.  Neck: Normal range of motion. Neck supple.  Pulmonary/Chest: Effort normal. No stridor. No respiratory distress. He has wheezes (diffuse).  Abdominal: Soft. There is no tenderness. There is no rebound and no guarding.  Musculoskeletal: He exhibits no edema.  Neurological: He is alert and oriented to person, place, and time. He displays normal reflexes. No cranial nerve deficit. He exhibits normal muscle tone. Coordination normal.  Skin: Skin is warm. No rash noted. He is not diaphoretic. No erythema.     ED Treatments / Results  DIAGNOSTIC STUDIES:  Oxygen Saturation is 100% on RA, normal by my interpretation.    COORDINATION OF CARE:  5:27 PM Discussed treatment plan with pt at bedside including CXR, breathing Tx with referral to pulmonologist and pt agreed to plan.  Labs (all labs ordered are listed, but only abnormal results are displayed) Labs Reviewed - No data to display  EKG  EKG Interpretation None       Radiology Dg Chest 2 View  Result Date: 10/31/2016 CLINICAL DATA:  Asthma, dyspnea EXAM: CHEST  2 VIEW COMPARISON:  11/15/2015 chest radiograph. FINDINGS: Stable cardiomediastinal silhouette with normal heart size. No pneumothorax. No pleural effusion. Lungs appear clear, with no acute consolidative airspace disease and no pulmonary edema. IMPRESSION: No active cardiopulmonary disease. Electronically Signed   By: Delbert Phenix M.D.   On: 10/31/2016 17:17    Procedures Procedures (including critical care time)  Medications Ordered in ED Medications  albuterol (PROVENTIL) (2.5 MG/3ML) 0.083% nebulizer solution (not administered)  ipratropium-albuterol (DUONEB) 0.5-2.5 (3) MG/3ML nebulizer solution 3 mL (3 mLs Nebulization  Given 10/31/16 1600)  albuterol (PROVENTIL) (2.5 MG/3ML) 0.083% nebulizer solution 2.5 mg (2.5 mg Nebulization Given 10/31/16 1600)     Initial Impression / Assessment and Plan / ED Course  I have reviewed the triage vital signs and the nursing notes.  Pertinent labs & imaging results that were available during my care of the patient were reviewed by me and considered in my medical decision making (see chart for details).     Brett Gilbert is a 31 y.o. male with a PMHx of Asthma, who presents to the Emergency Department complaining of gradually worsening SOB onset last night.  History and exam are seen above.  On exam, patient had wheezing in all lung fields. No rhonchi or other abnormalities on exam. No chest tenderness or abdominal tenderness. Mild rhinorrhea and congestion appreciated.  No other physical exam abnormality.  Based on patient's symptoms, suspect a exacerbation of his underlying asthma likely secondary to a viral infection with the congestion and rhinorrhea. Chest x-ray was obtained to rule out pneumonia and this was negative.  Patient given breathing treatment with improvement in his wheezing. Patient felt much better and had no more shortness of breath.  Patient also given prednisone in the ED. He reports he "always gets this" when he has exacerbations.  Given resolution of  shortness breath and wheezing, patient will be given prescription for prednisone for several days. Patient was given an inhaler as he reports he was out of his rescue inhaler currently. Patient continue to have resolution of his symptoms and understood return precautions for new or worsening symptoms. Patient encouraged to follow-up with PCP for development of further management strategies for his asthma. Patient had no other questions or concerns and understood plan of care. Patient discharged in good condition with resolution of presenting symptoms.    Final Clinical Impressions(s) / ED Diagnoses     Final diagnoses:  Mild asthma with exacerbation, unspecified whether persistent  Wheezing    New Prescriptions Discharge Medication List as of 10/31/2016  6:58 PM    START taking these medications   Details  !! predniSONE (DELTASONE) 20 MG tablet Take 3 tablets (60 mg total) by mouth daily., Starting Tue 10/31/2016, Until Sun 11/05/2016, Print     !! - Potential duplicate medications found. Please discuss with provider.     I personally performed the services described in this documentation, which was scribed in my presence. The recorded information has been reviewed and is accurate.  Clinical Impression: 1. Mild asthma with exacerbation, unspecified whether persistent   2. Wheezing     Disposition: Discharge  Condition: Good  I have discussed the results, Dx and Tx plan with the pt(& family if present). He/she/they expressed understanding and agree(s) with the plan. Discharge instructions discussed at great length. Strict return precautions discussed and pt &/or family have verbalized understanding of the instructions. No further questions at time of discharge.    Discharge Medication List as of 10/31/2016  6:58 PM    START taking these medications   Details  !! predniSONE (DELTASONE) 20 MG tablet Take 3 tablets (60 mg total) by mouth daily., Starting Tue 10/31/2016, Until Sun 11/05/2016, Print     !! - Potential duplicate medications found. Please discuss with provider.      Follow Up: [facility] [address] [place] [address] [phone] Schedule an appointment as soon as possible for a visit    [facility] EMERGENCY DEPARTMENT [address] [phone] [place] [address] [phone]  If symptoms worsen      Heide Scales, MD 10/31/16 2159

## 2016-11-23 ENCOUNTER — Emergency Department (HOSPITAL_COMMUNITY): Payer: Self-pay

## 2016-11-23 ENCOUNTER — Encounter (HOSPITAL_COMMUNITY): Payer: Self-pay | Admitting: Emergency Medicine

## 2016-11-23 ENCOUNTER — Emergency Department (HOSPITAL_COMMUNITY)
Admission: EM | Admit: 2016-11-23 | Discharge: 2016-11-23 | Disposition: A | Discharge status: Home / Self Care | Payer: Self-pay | Attending: Emergency Medicine | Admitting: Emergency Medicine

## 2016-11-23 DIAGNOSIS — Z79899 Other long term (current) drug therapy: Secondary | ICD-10-CM | POA: Insufficient documentation

## 2016-11-23 DIAGNOSIS — J4541 Moderate persistent asthma with (acute) exacerbation: Secondary | ICD-10-CM | POA: Insufficient documentation

## 2016-11-23 DIAGNOSIS — Z87891 Personal history of nicotine dependence: Secondary | ICD-10-CM | POA: Insufficient documentation

## 2016-11-23 LAB — BASIC METABOLIC PANEL
ANION GAP: 8 (ref 5–15)
BUN: 8 mg/dL (ref 6–20)
CHLORIDE: 109 mmol/L (ref 101–111)
CO2: 24 mmol/L (ref 22–32)
Calcium: 8.8 mg/dL — ABNORMAL LOW (ref 8.9–10.3)
Creatinine, Ser: 1.13 mg/dL (ref 0.61–1.24)
GFR calc non Af Amer: >60 mL/min (ref >60)
Glucose, Bld: 112 mg/dL — ABNORMAL HIGH (ref 65–99)
POTASSIUM: 3.9 mmol/L (ref 3.5–5.1)
SODIUM: 141 mmol/L (ref 135–145)

## 2016-11-23 LAB — CBC
HEMATOCRIT: 43.7 % (ref 39.0–52.0)
HEMOGLOBIN: 14.5 g/dL (ref 13.0–17.0)
MCH: 28.3 pg (ref 26.0–34.0)
MCHC: 33.2 g/dL (ref 30.0–36.0)
MCV: 85.4 fL (ref 78.0–100.0)
Platelets: 275 10*3/uL (ref 150–400)
RBC: 5.12 MIL/uL (ref 4.22–5.81)
RDW: 13.6 % (ref 11.5–15.5)
WBC: 7.3 10*3/uL (ref 4.0–10.5)

## 2016-11-23 MED ORDER — PREDNISONE 10 MG PO TABS: 60.0000 mg | ORAL_TABLET | Freq: Every day | ORAL | 0 refills | Status: DC

## 2016-11-23 MED ORDER — ALBUTEROL SULFATE HFA 108 (90 BASE) MCG/ACT IN AERS
2.0000 | INHALATION_SPRAY | RESPIRATORY_TRACT | Status: DC
  Administered 2016-11-23: 2 via RESPIRATORY_TRACT
  Filled 2016-11-23: qty 6.7

## 2016-11-23 MED ORDER — METHYLPREDNISOLONE SODIUM SUCC 125 MG IJ SOLR
125.0000 mg | Freq: Once | INTRAMUSCULAR | Status: AC
  Administered 2016-11-23: 125 mg via INTRAVENOUS
  Filled 2016-11-23: qty 2

## 2016-11-23 MED ORDER — ALBUTEROL SULFATE (2.5 MG/3ML) 0.083% IN NEBU
INHALATION_SOLUTION | RESPIRATORY_TRACT | Status: AC
  Filled 2016-11-23: qty 3

## 2016-11-23 MED ORDER — ALBUTEROL SULFATE (2.5 MG/3ML) 0.083% IN NEBU
5.0000 mg | INHALATION_SOLUTION | Freq: Once | RESPIRATORY_TRACT | Status: DC
  Filled 2016-11-23: qty 6

## 2016-11-23 MED ORDER — ALBUTEROL SULFATE HFA 108 (90 BASE) MCG/ACT IN AERS: 2.0000 | INHALATION_SPRAY | RESPIRATORY_TRACT | 0 refills | Status: DC | PRN

## 2016-11-23 MED ORDER — ALBUTEROL (5 MG/ML) CONTINUOUS INHALATION SOLN
10.0000 mg/h | INHALATION_SOLUTION | Freq: Once | RESPIRATORY_TRACT | Status: AC
  Administered 2016-11-23: 10 mg/h via RESPIRATORY_TRACT

## 2016-11-23 MED ORDER — IPRATROPIUM BROMIDE 0.02 % IN SOLN
0.5000 mg | Freq: Once | RESPIRATORY_TRACT | Status: AC
  Administered 2016-11-23: 0.5 mg via RESPIRATORY_TRACT

## 2016-11-23 MED ORDER — MAGNESIUM SULFATE 2 GM/50ML IV SOLN
2.0000 g | Freq: Once | INTRAVENOUS | Status: AC
  Administered 2016-11-23: 2 g via INTRAVENOUS
  Filled 2016-11-23: qty 50

## 2016-11-23 NOTE — ED Notes (Signed)
Pt ambulated to bathroom without difficulty. No distressed noted.

## 2016-11-23 NOTE — ED Provider Notes (Signed)
[facility] Provider Note   CSN: [id number] Arrival date & time: 11/23/16  1042     History   Chief Complaint Chief Complaint  Patient presents with  . Asthma   Level 5 caveat: Respiratory distress  HPI Brett Gilbert is a 31 y.o. male.  HPI Patient presents to the emergency department complaints of severe asthma exacerbation.  He presents short of breath and diaphoretic.  He states he ran out of his albuterol.  His had cough this week.  He is no other complaints at this time.  His history is limited secondary respiratory distress.  No other complaints at this time.  Patient presents in what appears to be extremis   Past Medical History:  Diagnosis Date  . Asthma     There are no active problems to display for this patient.   Past Surgical History:  Procedure Laterality Date  . KNEE SURGERY         Home Medications    Prior to Admission medications   Medication Sig Start Date End Date Taking? Authorizing Provider  albuterol (PROVENTIL HFA;VENTOLIN HFA) 108 (90 Base) MCG/ACT inhaler Inhale 2 puffs into the lungs every 4 (four) hours as needed for wheezing or shortness of breath. 11/23/16   Azalia Bilis, MD  predniSONE (DELTASONE) 10 MG tablet Take 6 tablets (60 mg total) by mouth daily. 11/23/16   Azalia Bilis, MD    Family History History reviewed. No pertinent family history.  Social History Social History  Substance Use Topics  . Smoking status: Former Smoker    Types: Cigarettes    Quit date: 01/06/2015  . Smokeless tobacco: Never Used  . Alcohol use No     Allergies   Patient has no known allergies.   Review of Systems Review of Systems  Unable to perform ROS: Severe respiratory distress     Physical Exam Updated Vital Signs BP 123/89   Pulse 98   Temp 98.5 F (36.9 C) (Oral)   Resp 12   SpO2 100%   Physical Exam  Constitutional: He appears well-developed. He appears distressed.  HENT:  Head: Normocephalic and atraumatic.    Eyes: EOM are normal.  Neck: Normal range of motion.  Cardiovascular: Regular rhythm.   Tachycardic  Pulmonary/Chest: He is in respiratory distress. He has wheezes.  Decreased breath sounds in the lower half of his lung fields bilaterally  Abdominal: Soft. He exhibits no distension. There is no tenderness.  Musculoskeletal: Normal range of motion.  Neurological: He is alert.  Skin: Skin is warm. He is diaphoretic.  Psychiatric: He has a normal mood and affect. Judgment normal.  Nursing note and vitals reviewed.    ED Treatments / Results  Labs (all labs ordered are listed, but only abnormal results are displayed) Labs Reviewed  BASIC METABOLIC PANEL - Abnormal; Notable for the following:       Result Value   Glucose, Bld 112 (*)    Calcium 8.8 (*)    All other components within normal limits  CBC    EKG  EKG Interpretation None       Radiology Dg Chest Portable 1 View  Result Date: 11/23/2016 CLINICAL DATA:  Shortness of breath, former smoker, history of asthma EXAM: PORTABLE CHEST 1 VIEW COMPARISON:  10/31/2016 FINDINGS: Lungs are clear.  No pleural effusion or pneumothorax. The heart is normal in size. IMPRESSION: No evidence of acute cardiopulmonary disease. Electronically Signed   By: Charline Bills M.D.   On: 11/23/2016 11:16  Procedures Procedures (including critical care time)  Medications Ordered in ED Medications  albuterol (PROVENTIL) (2.5 MG/3ML) 0.083% nebulizer solution 5 mg ( Nebulization Canceled Entry 11/23/16 1104)  albuterol (PROVENTIL HFA;VENTOLIN HFA) 108 (90 Base) MCG/ACT inhaler 2 puff (not administered)  magnesium sulfate IVPB 2 g 50 mL (0 g Intravenous Stopped 11/23/16 1254)  methylPREDNISolone sodium succinate (SOLU-MEDROL) 125 mg/2 mL injection 125 mg (125 mg Intravenous Given 11/23/16 1103)  albuterol (PROVENTIL,VENTOLIN) solution continuous neb (10 mg/hr Nebulization Given 11/23/16 1104)  ipratropium (ATROVENT) nebulizer solution 0.5 mg  (0.5 mg Nebulization Given 11/23/16 1104)     Initial Impression / Assessment and Plan / ED Course  I have reviewed the triage vital signs and the nursing notes.  Pertinent labs & imaging results that were available during my care of the patient were reviewed by me and considered in my medical decision making (see chart for details).     Patient with severe respiratory distress on arrival.  He did run out of his albuterol.  His soon as we initiated bronchodilator therapy he had significant improvement.  He now feels much better.  He is emanating around the ER without any difficulty.  No increased work of breathing.  No hypoxia.  He feels much better.  Patient be discharged home with steroids and bronchodilators.  He understands to return to the ER for new or worsening symptoms  Final Clinical Impressions(s) / ED Diagnoses   Final diagnoses:  Asthma in adult, moderate persistent, with acute exacerbation    New Prescriptions New Prescriptions   PREDNISONE (DELTASONE) 10 MG TABLET    Take 6 tablets (60 mg total) by mouth daily.     Azalia Bilis, MD 11/23/16 1256

## 2016-11-23 NOTE — ED Triage Notes (Signed)
Pt here for resp distress and asthma; pt distressed at present tipoding with accessory muscle use

## 2016-12-26 ENCOUNTER — Emergency Department (HOSPITAL_COMMUNITY): Payer: Self-pay

## 2016-12-26 ENCOUNTER — Emergency Department (HOSPITAL_COMMUNITY)
Admission: EM | Admit: 2016-12-26 | Discharge: 2016-12-26 | Disposition: A | Discharge status: Home / Self Care | Payer: Self-pay | Attending: Emergency Medicine | Admitting: Emergency Medicine

## 2016-12-26 ENCOUNTER — Encounter (HOSPITAL_COMMUNITY): Payer: Self-pay | Admitting: Emergency Medicine

## 2016-12-26 DIAGNOSIS — J4521 Mild intermittent asthma with (acute) exacerbation: Secondary | ICD-10-CM | POA: Insufficient documentation

## 2016-12-26 DIAGNOSIS — J45901 Unspecified asthma with (acute) exacerbation: Secondary | ICD-10-CM

## 2016-12-26 DIAGNOSIS — Z87891 Personal history of nicotine dependence: Secondary | ICD-10-CM | POA: Insufficient documentation

## 2016-12-26 MED ORDER — ALBUTEROL SULFATE (2.5 MG/3ML) 0.083% IN NEBU
INHALATION_SOLUTION | RESPIRATORY_TRACT | Status: AC
  Filled 2016-12-26: qty 6

## 2016-12-26 MED ORDER — ALBUTEROL SULFATE HFA 108 (90 BASE) MCG/ACT IN AERS
2.0000 | INHALATION_SPRAY | Freq: Once | RESPIRATORY_TRACT | Status: AC
  Administered 2016-12-26: 2 via RESPIRATORY_TRACT
  Filled 2016-12-26: qty 6.7

## 2016-12-26 MED ORDER — PREDNISONE 20 MG PO TABS: 40.0000 mg | ORAL_TABLET | Freq: Every day | ORAL | 0 refills | Status: DC

## 2016-12-26 MED ORDER — ALBUTEROL SULFATE (2.5 MG/3ML) 0.083% IN NEBU
5.0000 mg | INHALATION_SOLUTION | Freq: Once | RESPIRATORY_TRACT | Status: AC
  Administered 2016-12-26: 5 mg via RESPIRATORY_TRACT

## 2016-12-26 MED ORDER — IPRATROPIUM-ALBUTEROL 0.5-2.5 (3) MG/3ML IN SOLN
3.0000 mL | Freq: Once | RESPIRATORY_TRACT | Status: AC
  Administered 2016-12-26: 3 mL via RESPIRATORY_TRACT
  Filled 2016-12-26: qty 3

## 2016-12-26 MED ORDER — ALBUTEROL (5 MG/ML) CONTINUOUS INHALATION SOLN: 10.0000 mg/h | INHALATION_SOLUTION | RESPIRATORY_TRACT | Status: DC

## 2016-12-26 NOTE — ED Triage Notes (Addendum)
Per EMS. Pt to ED for asthma. Pt states he ran out of nebs last night. Sat 92% RA on arrival. Pt given albuterol neb on scene. Pt given duoneb and 125 mg solumedrol en route.  Inspiratory and expiratory wheezes in triage. Pt chest is tight. No pain.

## 2016-12-26 NOTE — Discharge Instructions (Signed)
Take your medications as prescribed. Continue using your albuterol inhaler at home as needed for shortness of breath/wheezing. I recommend calling the primary care provider's office listed below this afternoon to schedule a follow-up appointment for reevaluation and further management of your asthma. Please return to the Emergency Department if symptoms worsen or new onset of fever, chest pain, difficult to breathing, coughing up blood, abdominal pain, vomiting, lightheadedness, syncope.

## 2016-12-26 NOTE — ED Provider Notes (Signed)
[facility]-EMERGENCY DEPT Provider Note   CSN: [id number] Arrival date & time: 12/26/16  [id number]     History   Chief Complaint Chief Complaint  Patient presents with  . Asthma    HPI Brett Gilbert is a 31 y.o. male.  HPI   Patient is a 31 year old male with history of asthma who presents the ED with complaint of asthma exacerbation. Patient reports he was feeling fine last night but states any woke up this morning he had wheezing with associated shortness of breath, dry cough and chest tightness consistent with his usual asthma exacerbations. He states he only had 1 puff left in his albuterol inhaler which use prior to arrival without relief. Denies fever, chills, hemoptysis, chest pain, abdominal pain, vomiting. Denies any known sick contacts. Denies smoking. EMS administered neb treatment and Solu-Medrol prior to arrival.  Past Medical History:  Diagnosis Date  . Asthma     There are no active problems to display for this patient.   Past Surgical History:  Procedure Laterality Date  . KNEE SURGERY         Home Medications    Prior to Admission medications   Medication Sig Start Date End Date Taking? Authorizing Provider  albuterol (PROVENTIL HFA;VENTOLIN HFA) 108 (90 Base) MCG/ACT inhaler Inhale 2 puffs into the lungs every 4 (four) hours as needed for wheezing or shortness of breath. 11/23/16  Yes Azalia Bilisampos, Kevin, MD  predniSONE (DELTASONE) 20 MG tablet Take 2 tablets (40 mg total) by mouth daily. 12/26/16   Barrett HenleNadeau, Nicole Elizabeth, PA-C    Family History History reviewed. No pertinent family history.  Social History Social History  Substance Use Topics  . Smoking status: Former Smoker    Types: Cigarettes    Quit date: 01/06/2015  . Smokeless tobacco: Never Used  . Alcohol use No     Allergies   Patient has no known allergies.   Review of Systems Review of Systems  Respiratory: Positive for cough, chest tightness, shortness of breath and wheezing.   All  other systems reviewed and are negative.    Physical Exam Updated Vital Signs BP 109/68   Pulse 97   Temp 98 F (36.7 C) (Oral)   Resp (!) 21   Ht 6\' 2"  (1.88 m)   Wt 69.4 kg (153 lb)   SpO2 96%   BMI 19.64 kg/m   Physical Exam  Constitutional: He is oriented to person, place, and time. He appears well-developed and well-nourished. No distress.  HENT:  Head: Normocephalic and atraumatic.  Mouth/Throat: Oropharynx is clear and moist. No oropharyngeal exudate.  Eyes: Conjunctivae and EOM are normal. Right eye exhibits no discharge. Left eye exhibits no discharge. No scleral icterus.  Neck: Normal range of motion. Neck supple.  Cardiovascular: Normal rate, regular rhythm, normal heart sounds and intact distal pulses.   Pulmonary/Chest: Effort normal. No respiratory distress. He has wheezes. He has no rales. He exhibits no tenderness.  Diffuse expiratory wheezing throughout  Abdominal: Soft. Bowel sounds are normal. He exhibits no distension. There is no tenderness.  Musculoskeletal: Normal range of motion. He exhibits no edema.  Neurological: He is alert and oriented to person, place, and time.  Skin: Skin is warm and dry. He is not diaphoretic.  Nursing note and vitals reviewed.    ED Treatments / Results  Labs (all labs ordered are listed, but only abnormal results are displayed) Labs Reviewed - No data to display  EKG  EKG Interpretation None  Radiology Dg Chest 2 View  Result Date: 12/26/2016 CLINICAL DATA:  Wheezing and cough.  Asthma. EXAM: CHEST  2 VIEW COMPARISON:  11/23/2016 FINDINGS: Normal heart size and mediastinal contours. No acute infiltrate or edema. No effusion or pneumothorax. Stable mild upper thoracic levocurvature. No acute osseous findings. IMPRESSION: No evidence of active disease. Electronically Signed   By: Marnee Spring M.D.   On: 12/26/2016 07:12    Procedures Procedures (including critical care time)  Medications Ordered in  ED Medications  albuterol (PROVENTIL HFA;VENTOLIN HFA) 108 (90 Base) MCG/ACT inhaler 2 puff (not administered)  albuterol (PROVENTIL) (2.5 MG/3ML) 0.083% nebulizer solution 5 mg (5 mg Nebulization Given 12/26/16 0647)  ipratropium-albuterol (DUONEB) 0.5-2.5 (3) MG/3ML nebulizer solution 3 mL (3 mLs Nebulization Given 12/26/16 0847)     Initial Impression / Assessment and Plan / ED Course  I have reviewed the triage vital signs and the nursing notes.  Pertinent labs & imaging results that were available during my care of the patient were reviewed by me and considered in my medical decision making (see chart for details).     Patient presents with asthma exacerbation that started this morning. Reports running out of his albuterol inhaler this morning. Denies fever, chest pain, hemoptysis. VSS. Exam revealed patient with diffuse expiratory wheezes without signs of respiratory distress or increased work of breathing. Patient given 2 additional neb treatments in the ED. Chest x-ray negative. On reevaluation patient reports significant improvement of symptoms and states he feels at baseline. Reexamination revealed significant improvement of wheezing throughout. Patient has remained hemodynamically stable while in the ED. Plan to discharge patient home with albuterol inhaler and prescription for steroids. Patient given information to follow up with PCP outpatient for reevaluation and further management of his asthma. Discussed return precautions.  Final Clinical Impressions(s) / ED Diagnoses   Final diagnoses:  Mild asthma with exacerbation, unspecified whether persistent    New Prescriptions New Prescriptions   PREDNISONE (DELTASONE) 20 MG TABLET    Take 2 tablets (40 mg total) by mouth daily.     Barrett Henle, PA-C 12/26/16 1610    Alvira Monday, MD 12/27/16 [phone]

## 2017-02-11 ENCOUNTER — Emergency Department (HOSPITAL_COMMUNITY): Payer: BC Managed Care – PPO

## 2017-02-11 ENCOUNTER — Emergency Department (HOSPITAL_COMMUNITY)
Admission: EM | Admit: 2017-02-11 | Discharge: 2017-02-11 | Disposition: A | Discharge status: Home / Self Care | Payer: BC Managed Care – PPO | Attending: Emergency Medicine | Admitting: Emergency Medicine

## 2017-02-11 DIAGNOSIS — Z87891 Personal history of nicotine dependence: Secondary | ICD-10-CM | POA: Insufficient documentation

## 2017-02-11 DIAGNOSIS — J4541 Moderate persistent asthma with (acute) exacerbation: Secondary | ICD-10-CM

## 2017-02-11 DIAGNOSIS — Z79899 Other long term (current) drug therapy: Secondary | ICD-10-CM | POA: Insufficient documentation

## 2017-02-11 MED ORDER — ALBUTEROL SULFATE HFA 108 (90 BASE) MCG/ACT IN AERS
4.0000 | INHALATION_SPRAY | Freq: Once | RESPIRATORY_TRACT | Status: AC
  Administered 2017-02-11: 4 via RESPIRATORY_TRACT
  Filled 2017-02-11: qty 6.7

## 2017-02-11 MED ORDER — ALBUTEROL SULFATE HFA 108 (90 BASE) MCG/ACT IN AERS: 2.0000 | INHALATION_SPRAY | RESPIRATORY_TRACT | 0 refills | Status: DC | PRN

## 2017-02-11 MED ORDER — PREDNISONE 10 MG PO TABS: 40.0000 mg | ORAL_TABLET | Freq: Every day | ORAL | 0 refills | Status: AC
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
## 2017-02-11 NOTE — ED Notes (Signed)
Pt states he understands instructions. Home stable with father with steady gait.

## 2017-02-11 NOTE — ED Provider Notes (Signed)
[facility] DEPT Provider Note   CSN: [id number] Arrival date & time: 02/11/17  1213     History   Chief Complaint Chief Complaint  Patient presents with  . Shortness of Breath    HPI Brett Gilbert is a 31 y.o. male.  HPI  31 year old male with a history of asthma presents to the emergency department with 4 hours of shortness of breath. Patient reports not having an inhaler for the past several days due to the inhaler breaking, but reports she last needed it for almost a week. States that his shortness of breath is consistent with his prior asthma exacerbations. Denies any recent fevers, URI symptoms, cough, chills, chest pain, leg swelling. The patient contacted EMS who gave the patient's steroids and breathing treatment, which significantly improved his symptoms. Currently feels asymptomatic and not short of breath. No other alleviating or aggravating factors. No other physical complaints.   Past Medical History:  Diagnosis Date  . Asthma     There are no active problems to display for this patient.   Past Surgical History:  Procedure Laterality Date  . KNEE SURGERY         Home Medications    Prior to Admission medications   Medication Sig Start Date End Date Taking? Authorizing Provider  ibuprofen (ADVIL,MOTRIN) 200 MG tablet Take 400 mg by mouth every 6 (six) hours as needed for mild pain.   Yes [provider]  albuterol (PROVENTIL HFA;VENTOLIN HFA) 108 (90 Base) MCG/ACT inhaler Inhale 2 puffs into the lungs every 4 (four) hours as needed for wheezing or shortness of breath. 02/11/17   Nira Conn, MD  predniSONE (DELTASONE) 10 MG tablet Take 4 tablets (40 mg total) by mouth daily. 02/11/17 02/15/17  Nira Conn, MD    Family History No family history on file.  Social History Social History  Substance Use Topics  . Smoking status: Former Smoker    Types: Cigarettes    Quit date: 01/06/2015  . Smokeless tobacco: Never Used  .  Alcohol use No     Allergies   Patient has no known allergies.   Review of Systems Review of Systems All other systems are reviewed and are negative for acute change except as noted in the HPI   Physical Exam Updated Vital Signs BP 128/67   Pulse 82   Resp 18   Ht 6\' 2"  (1.88 m)   Wt 69.4 kg (153 lb)   SpO2 100%   BMI 19.64 kg/m   Physical Exam  Constitutional: He is oriented to person, place, and time. He appears well-developed and well-nourished. No distress.  HENT:  Head: Normocephalic and atraumatic.  Nose: Nose normal.  Eyes: Conjunctivae and EOM are normal. Pupils are equal, round, and reactive to light. Right eye exhibits no discharge. Left eye exhibits no discharge. No scleral icterus.  Neck: Normal range of motion. Neck supple.  Cardiovascular: Normal rate and regular rhythm.  Exam reveals no gallop and no friction rub.   No murmur heard. Pulmonary/Chest: Effort normal and breath sounds normal. No stridor. No respiratory distress. He has no rales.  Abdominal: Soft. He exhibits no distension. There is no tenderness.  Musculoskeletal: He exhibits no edema or tenderness.  Neurological: He is alert and oriented to person, place, and time.  Skin: Skin is warm and dry. No rash noted. He is not diaphoretic. No erythema.  Psychiatric: He has a normal mood and affect.  Vitals reviewed.    ED Treatments / Results  Labs (all labs ordered are listed, but only abnormal results are displayed) Labs Reviewed - No data to display  EKG  EKG Interpretation None       Radiology No results found.  Procedures Procedures (including critical care time)  Medications Ordered in ED Medications  albuterol (PROVENTIL HFA;VENTOLIN HFA) 108 (90 Base) MCG/ACT inhaler 4 puff (not administered)     Initial Impression / Assessment and Plan / ED Course  I have reviewed the triage vital signs and the nursing notes.  Pertinent labs & imaging results that were available during  my care of the patient were reviewed by me and considered in my medical decision making (see chart for details).     Consistent with asthma exacerbation improved with breathing treatments from EMS. We'll monitor and reexamined.   Patient provided with albuterol inhaler.  On reexamination the patient remained asymptomatic without increase in his work of breathing. Lung exam clear.  Physical he is safe for discharge with strict return precautions.  Final Clinical Impressions(s) / ED Diagnoses   Final diagnoses:  Moderate persistent asthma with exacerbation   Disposition: Discharge  Condition: Good  I have discussed the results, Dx and Tx plan with the patient who expressed understanding and agree(s) with the plan. Discharge instructions discussed at great length. The patient was given strict return precautions who verbalized understanding of the instructions. No further questions at time of discharge.    New Prescriptions   PREDNISONE (DELTASONE) 10 MG TABLET    Take 4 tablets (40 mg total) by mouth daily.    Follow Up: Primary care provider   Establish care for further management of your asthma      Anthone Prieur, Amadeo GarnetPedro Eduardo, MD 02/11/17 [phone]

## 2017-04-18 ENCOUNTER — Ambulatory Visit: Payer: BC Managed Care – PPO | Admitting: Family Medicine

## 2017-04-24 ENCOUNTER — Ambulatory Visit: Payer: BC Managed Care – PPO | Admitting: Family Medicine

## 2017-04-24 DIAGNOSIS — Z0289 Encounter for other administrative examinations: Secondary | ICD-10-CM

## 2017-04-28 ENCOUNTER — Encounter (HOSPITAL_COMMUNITY): Payer: Self-pay | Admitting: Emergency Medicine

## 2017-04-28 ENCOUNTER — Emergency Department (HOSPITAL_COMMUNITY)
Admission: EM | Admit: 2017-04-28 | Discharge: 2017-04-28 | Disposition: A | Discharge status: Home / Self Care | Payer: BC Managed Care – PPO | Attending: Emergency Medicine | Admitting: Emergency Medicine

## 2017-04-28 DIAGNOSIS — J4521 Mild intermittent asthma with (acute) exacerbation: Secondary | ICD-10-CM

## 2017-04-28 DIAGNOSIS — Z87891 Personal history of nicotine dependence: Secondary | ICD-10-CM | POA: Insufficient documentation

## 2017-04-28 MED ORDER — PREDNISONE 10 MG PO TABS: 20.0000 mg | ORAL_TABLET | Freq: Two times a day (BID) | ORAL | 0 refills | Status: DC

## 2017-04-28 MED ORDER — METHYLPREDNISOLONE SODIUM SUCC 40 MG IJ SOLR
40.0000 mg | Freq: Once | INTRAMUSCULAR | Status: AC
  Administered 2017-04-28: 40 mg via INTRAVENOUS
  Filled 2017-04-28: qty 1

## 2017-04-28 MED ORDER — ALBUTEROL SULFATE HFA 108 (90 BASE) MCG/ACT IN AERS
2.0000 | INHALATION_SPRAY | RESPIRATORY_TRACT | Status: DC | PRN
  Administered 2017-04-28: 2 via RESPIRATORY_TRACT
  Filled 2017-04-28: qty 6.7

## 2017-04-28 NOTE — ED Provider Notes (Signed)
[facility]-EMERGENCY DEPT Provider Note   CSN: [id number] Arrival date & time: 04/28/17  1509     History   Chief Complaint Chief Complaint  Patient presents with  . Asthma    HPI Brett Gilbert is a 31 y.o. male who presents to the ED with asthma attack. The symptoms started this morning. Patient reports that he does not have an inhaler so he called 911 when he was having difficulty breathing. Patient reports this is similar to previous asthma attacks. Patient reports that by the time EMS gave him a neb treatment and he got here and had a second treatment that his symptoms had resolved.  The history is provided by the patient. No language interpreter was used.  Asthma  This is a recurrent problem. The current episode started less than 1 hour ago. Associated symptoms include shortness of breath. Pertinent negatives include no chest pain and no headaches. Relieved by: neb treatments.    Past Medical History:  Diagnosis Date  . Asthma     There are no active problems to display for this patient.   Past Surgical History:  Procedure Laterality Date  . KNEE SURGERY         Home Medications    Prior to Admission medications   Medication Sig Start Date End Date Taking? Authorizing Provider  albuterol (PROVENTIL HFA;VENTOLIN HFA) 108 (90 Base) MCG/ACT inhaler Inhale 2 puffs into the lungs every 4 (four) hours as needed for wheezing or shortness of breath. 02/11/17   Cardama, Amadeo Garnet, MD  ibuprofen (ADVIL,MOTRIN) 200 MG tablet Take 400 mg by mouth every 6 (six) hours as needed for mild pain.    [provider]  predniSONE (DELTASONE) 10 MG tablet Take 2 tablets (20 mg total) by mouth 2 (two) times daily with a meal. 04/28/17   Janne Napoleon, NP    Family History Family History  Problem Relation Age of Onset  . Multiple sclerosis Mother     Social History Social History  Substance Use Topics  . Smoking status: Former Smoker    Types: Cigarettes    Quit date:  01/06/2015  . Smokeless tobacco: Never Used  . Alcohol use No     Allergies   Patient has no known allergies.   Review of Systems Review of Systems  Constitutional: Negative for chills and fever.  HENT: Negative.   Eyes: Negative for discharge, redness and itching.  Respiratory: Positive for shortness of breath and wheezing. Negative for cough.   Cardiovascular: Negative for chest pain.  Gastrointestinal: Negative for nausea and vomiting.  Musculoskeletal: Negative for arthralgias.  Skin: Negative for rash.  Neurological: Negative for syncope and headaches.  Psychiatric/Behavioral: Negative for confusion.     Physical Exam Updated Vital Signs BP 119/69 (BP Location: Left Arm)   Pulse 77   Resp 20   Wt 68.5 kg (151 lb)   SpO2 94% Comment: lowest with ambulation.  No shortness of breath  BMI 19.39 kg/m   Physical Exam  Constitutional: He appears well-developed and well-nourished. No distress.  HENT:  Head: Normocephalic.  Eyes: EOM are normal.  Neck: Neck supple.  Cardiovascular: Normal rate and regular rhythm.   Pulmonary/Chest: Effort normal.  After 2 breathing treatments patient's lungs are clear and he has no difficulty breathing.   Abdominal: Soft. There is no tenderness.  Musculoskeletal: Normal range of motion.  Neurological: He is alert.  Skin: Skin is warm and dry.  Psychiatric: He has a normal mood and affect. His  behavior is normal.  Nursing note and vitals reviewed.    ED Treatments / Results  Labs (all labs ordered are listed, but only abnormal results are displayed) Labs Reviewed - No data to display   Radiology No results found.  Procedures Procedures (including critical care time)  Medications Ordered in ED Medications  methylPREDNISolone sodium succinate (SOLU-MEDROL) 40 mg/mL injection 40 mg (40 mg Intravenous Given 04/28/17 1607)     Initial Impression / Assessment and Plan / ED Course  I have reviewed the triage vital signs and  the nursing notes.  Patient ambulated in ED with O2 saturations maintained >90, no current signs of respiratory distress. Lung exam improved after nebulizer treatment. Prednisone given in the ED and pt will be discharged with 5 day burst. Pt states they are breathing at baseline. Pt has been instructed to continue using prescribed medications and to speak with PCP about today's exacerbation.  Final Clinical Impressions(s) / ED Diagnoses   Final diagnoses:  Mild intermittent asthma with exacerbation    New Prescriptions Discharge Medication List as of 04/28/2017  4:04 PM    START taking these medications   Details  predniSONE (DELTASONE) 10 MG tablet Take 2 tablets (20 mg total) by mouth 2 (two) times daily with a meal., Starting Sat 04/28/2017, Print         [place], [place], [place] 04/28/17 2142    Doug Sou, MD 04/29/17 1734

## 2017-04-28 NOTE — ED Triage Notes (Signed)
Per EMS- pt called with c/o asthma attack. Pt alert, oriented and ambulatory. Tx with Albuterol , ,  Atrovent, solumedrol . IV . Masrked improvent from intial wheezing. Tx in progress.

## 2017-04-28 NOTE — ED Triage Notes (Signed)
Pt stated that he called EMS 1 hour after the wheezing started. Pt currently has bilateral inspiratory and expiratory wheezing. Pt not having difficulty speaking. No cough noted.

## 2017-04-28 NOTE — Discharge Instructions (Signed)
Use your inhaler 2 puffs every 4 hours as needed. Follow up with your doctor. Return here for worsening symptoms.

## 2017-09-15 ENCOUNTER — Emergency Department (HOSPITAL_COMMUNITY)
Admission: EM | Admit: 2017-09-15 | Discharge: 2017-09-15 | Disposition: A | Discharge status: Home / Self Care | Payer: BC Managed Care – PPO | Attending: Emergency Medicine | Admitting: Emergency Medicine

## 2017-09-15 DIAGNOSIS — Z87891 Personal history of nicotine dependence: Secondary | ICD-10-CM | POA: Insufficient documentation

## 2017-09-15 DIAGNOSIS — J4521 Mild intermittent asthma with (acute) exacerbation: Secondary | ICD-10-CM | POA: Insufficient documentation

## 2017-09-15 MED ORDER — ALBUTEROL SULFATE HFA 108 (90 BASE) MCG/ACT IN AERS
1.0000 | INHALATION_SPRAY | Freq: Once | RESPIRATORY_TRACT | Status: AC
  Administered 2017-09-15: 1 via RESPIRATORY_TRACT
  Filled 2017-09-15: qty 6.7

## 2017-09-15 MED ORDER — ALBUTEROL SULFATE HFA 108 (90 BASE) MCG/ACT IN AERS: 1.0000 | INHALATION_SPRAY | Freq: Four times a day (QID) | RESPIRATORY_TRACT | 0 refills | Status: DC | PRN

## 2017-09-15 MED ORDER — PREDNISONE 10 MG PO TABS: 40.0000 mg | ORAL_TABLET | Freq: Every day | ORAL | 0 refills | Status: AC

## 2017-09-15 MED ORDER — IPRATROPIUM-ALBUTEROL 0.5-2.5 (3) MG/3ML IN SOLN
3.0000 mL | Freq: Once | RESPIRATORY_TRACT | Status: AC
  Administered 2017-09-15: 3 mL via RESPIRATORY_TRACT
  Filled 2017-09-15: qty 3

## 2017-09-15 NOTE — ED Notes (Signed)
Bed: WA04 Expected date: 09/15/17 Expected time: 7:33 AM Means of arrival: Ambulance Comments: 32 yo Asthma
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
## 2018-01-05 IMAGING — CR DG CHEST 2V
2 series · 2 of 2 positions shown · non-contrast
Comparison: 06/27/2015

CLINICAL DATA: Short of breath.  Asthma

EXAM:
CHEST  2 VIEW

[chest pa]
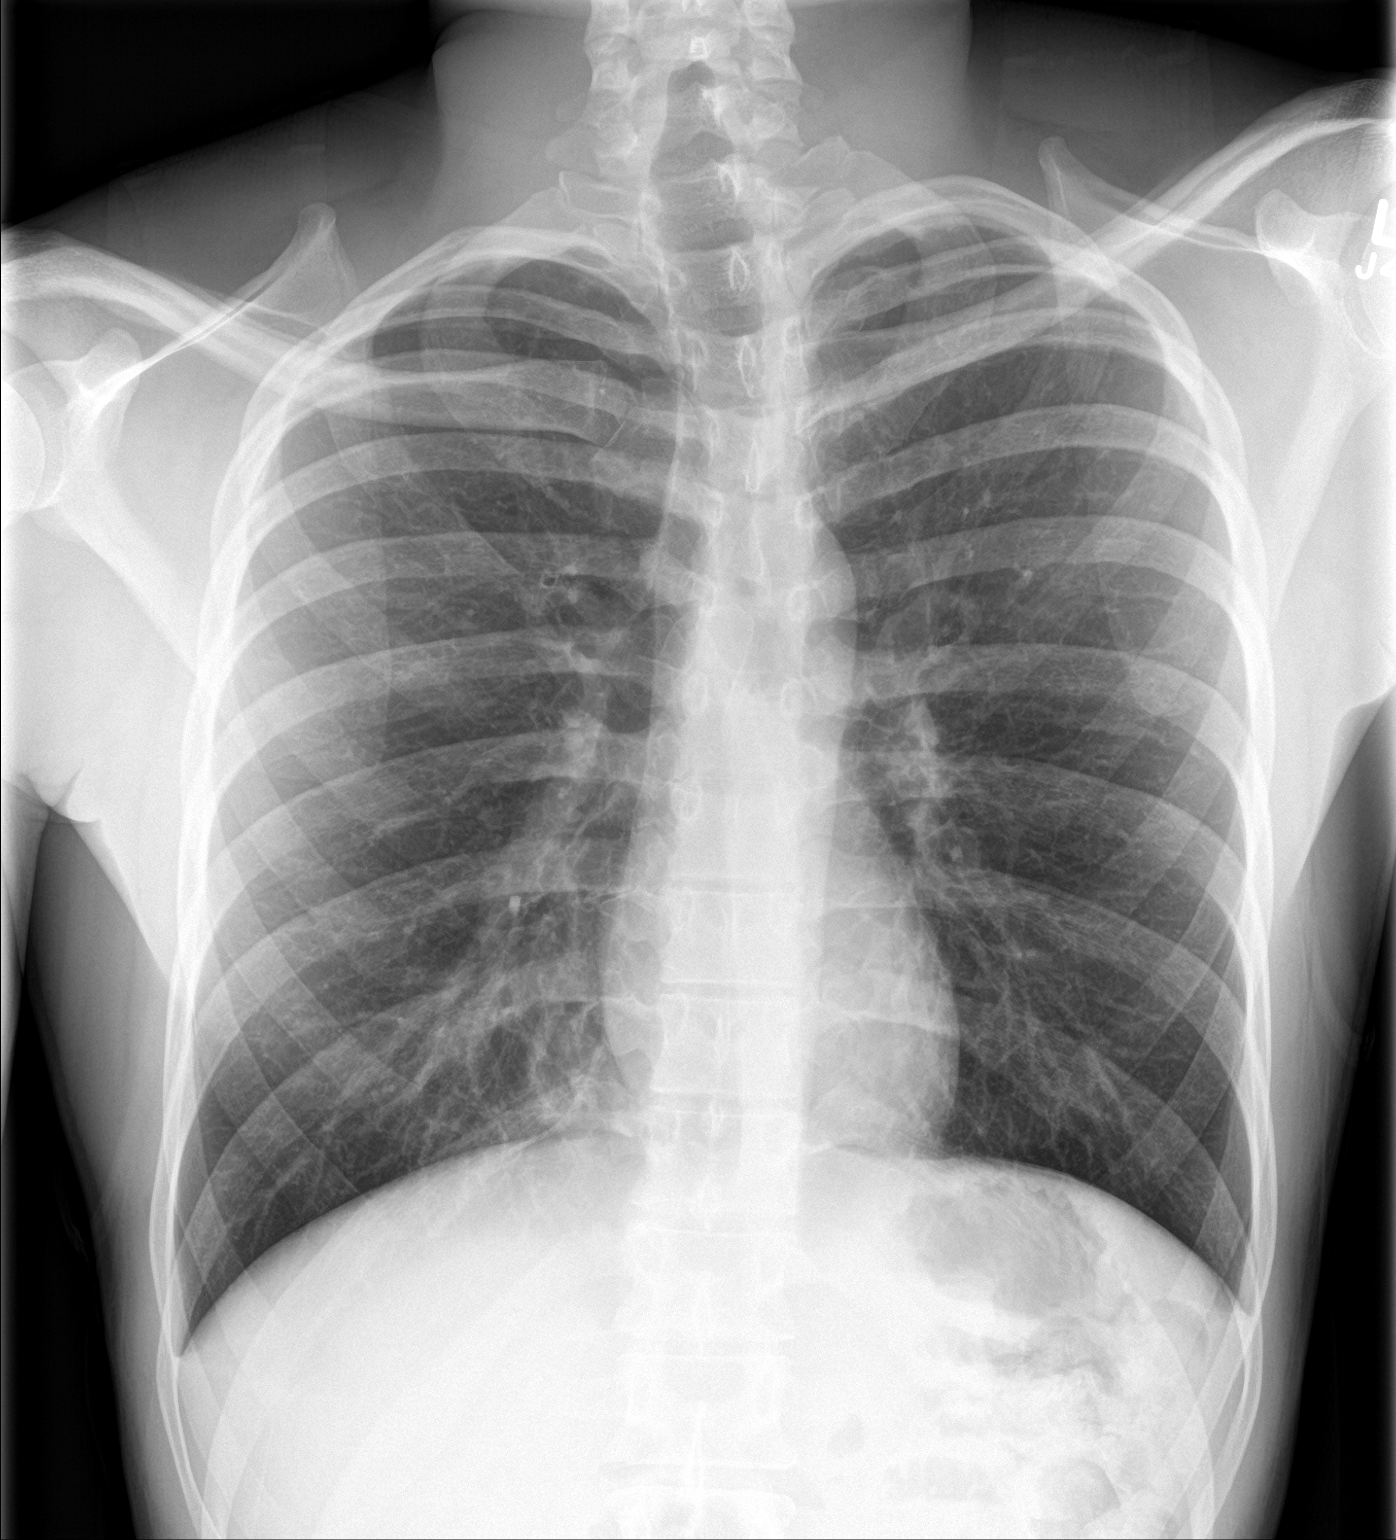

[chest lat]
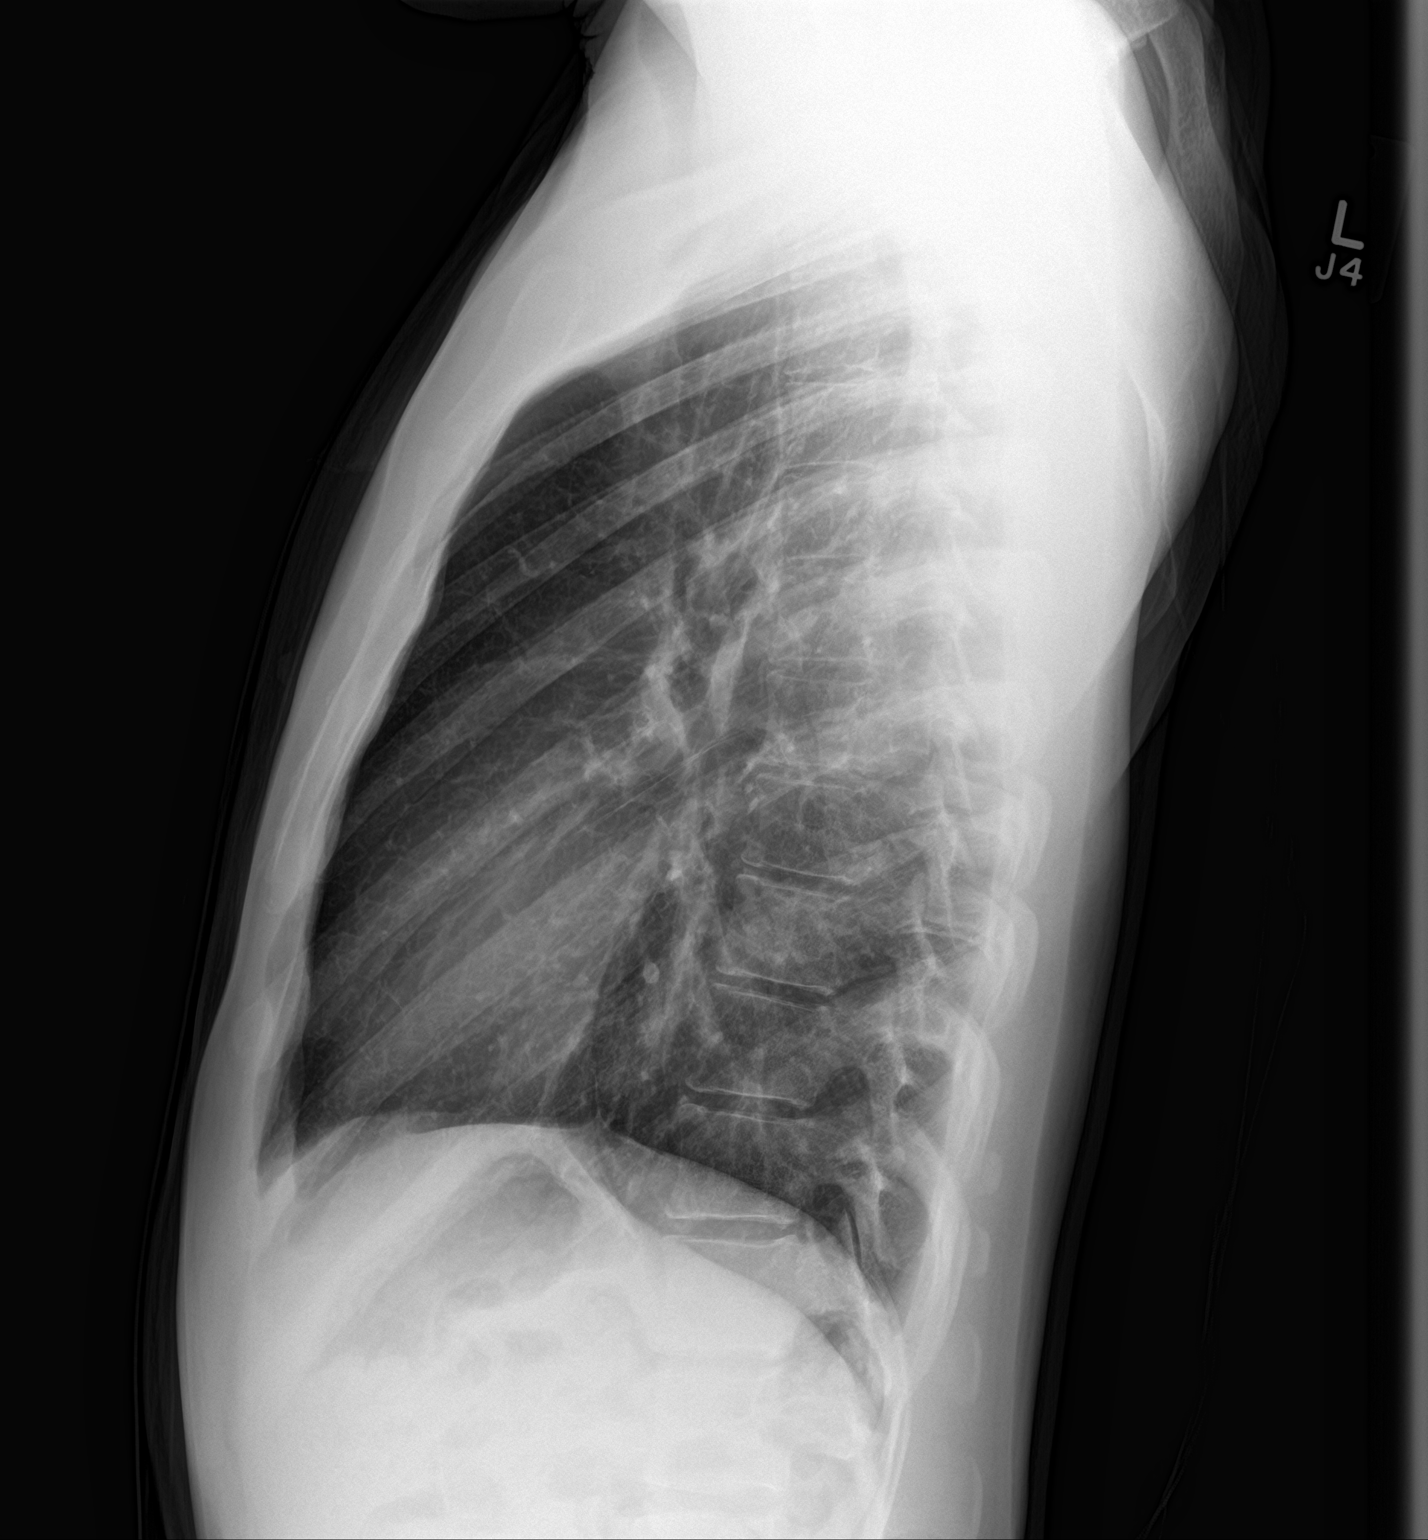

[2 of 2 positions shown; findings below may reference images not displayed]

FINDINGS: The heart size and mediastinal contours are within normal limits.
Both lungs are clear. The visualized skeletal structures are
unremarkable.
IMPRESSION: No active cardiopulmonary disease.

## 2018-06-24 ENCOUNTER — Encounter (HOSPITAL_COMMUNITY): Payer: Self-pay

## 2018-06-24 ENCOUNTER — Other Ambulatory Visit: Payer: Self-pay

## 2018-06-24 ENCOUNTER — Emergency Department (HOSPITAL_COMMUNITY)
Admission: EM | Admit: 2018-06-24 | Discharge: 2018-06-24 | Disposition: A | Discharge status: Home / Self Care | Payer: BC Managed Care – PPO | Attending: Emergency Medicine | Admitting: Emergency Medicine

## 2018-06-24 DIAGNOSIS — J45909 Unspecified asthma, uncomplicated: Secondary | ICD-10-CM | POA: Insufficient documentation

## 2018-06-24 DIAGNOSIS — Z87891 Personal history of nicotine dependence: Secondary | ICD-10-CM | POA: Insufficient documentation

## 2018-06-24 DIAGNOSIS — K047 Periapical abscess without sinus: Secondary | ICD-10-CM | POA: Insufficient documentation

## 2018-06-24 DIAGNOSIS — Z79899 Other long term (current) drug therapy: Secondary | ICD-10-CM | POA: Insufficient documentation

## 2018-06-24 MED ORDER — AMOXICILLIN 500 MG PO CAPS: 500.0000 mg | ORAL_CAPSULE | Freq: Three times a day (TID) | ORAL | 0 refills | Status: DC

## 2018-06-24 NOTE — ED Provider Notes (Signed)
[facility] EMERGENCY DEPARTMENT Provider Note   CSN: [id number] Arrival date & time: 06/24/18  1121     History   Chief Complaint Chief Complaint  Patient presents with  . Dental Pain    HPI Brett Gilbert is a 32 y.o. male who presents to the ED with dental pain. The pain started 3 days ago. Patient taking OTC medication without relief. The pain and swelling is to the right side of face. Patient reports he can not see his dentist for 3 days.  The history is provided by the patient. No language interpreter was used.  Dental Pain   This is a new problem. The current episode started more than 2 days ago. The problem occurs constantly. The problem has been gradually worsening.    Past Medical History:  Diagnosis Date  . Asthma     There are no active problems to display for this patient.   Past Surgical History:  Procedure Laterality Date  . KNEE SURGERY          Home Medications    Prior to Admission medications   Medication Sig Start Date End Date Taking? Authorizing Provider  albuterol (PROVENTIL HFA;VENTOLIN HFA) 108 (90 Base) MCG/ACT inhaler Inhale 1-2 puffs into the lungs every 6 (six) hours as needed for wheezing or shortness of breath. 09/15/17   Khatri, Hina, PA-C  amoxicillin (AMOXIL) 500 MG capsule Take 1 capsule (500 mg total) by mouth 3 (three) times daily. 06/24/18   Janne Napoleon, NP    Family History Family History  Problem Relation Age of Onset  . Multiple sclerosis Mother     Social History Social History   Tobacco Use  . Smoking status: Former Smoker    Types: Cigarettes    Last attempt to quit: 01/06/2015    Years since quitting: 3.4  . Smokeless tobacco: Never Used  Substance Use Topics  . Alcohol use: No  . Drug use: No     Allergies   Patient has no known allergies.   Review of Systems Review of Systems  HENT: Positive for dental problem.   All other systems reviewed and are negative.    Physical  Exam Updated Vital Signs BP (!) 152/97 (BP Location: Right Arm)   Pulse (!) 108   Temp 98.6 F (37 C) (Oral)   Resp 18   Ht 6\' 2"  (1.88 m)   Wt 70.3 kg   SpO2 97%   BMI 19.90 kg/m   Physical Exam  Constitutional: He appears well-developed and well-nourished. No distress.  HENT:  Head: Normocephalic.  Mouth/Throat:    Right lower dental area with tooth broken and decayed to the gumline. Erythema and   Eyes: EOM are normal.  Neck: Neck supple.  Cardiovascular: Normal rate.  Pulmonary/Chest: Effort normal.  Musculoskeletal: Normal range of motion.  Neurological: He is alert.  Skin: Skin is warm and dry.  Psychiatric: He has a normal mood and affect.  Nursing note and vitals reviewed.    ED Treatments / Results  Labs (all labs ordered are listed, but only abnormal results are displayed) Labs Reviewed - No data to display  Radiology No results found.  Procedures .Nerve Block Date/Time: 06/24/2018 12:12 PM Performed by: Janne Napoleon, NP Authorized by: Janne Napoleon, NP   Consent:    Consent obtained:  Verbal   Consent given by:  Patient   Risks discussed:  Swelling, pain, allergic reaction and bleeding   Alternatives discussed:  Alternative  treatment Indications:    Indications:  Pain relief Location:    Body area:  Head (dental inferior alveolar)   Laterality:  Right Skin anesthesia (see MAR for exact dosages):    Skin anesthesia method:  Topical application   Topical anesthetic:  Benzocaine gel Procedure details (see MAR for exact dosages):    Block needle gauge:  27 G   Anesthetic injected:  Bupivacaine 0.25% WITH epi   Steroid injected:  None   Additive injected:  None   Injection procedure:  Anatomic landmarks identified, incremental injection and introduced needle   Paresthesia:  None Post-procedure details:    Outcome:  Anesthesia achieved   Patient tolerance of procedure:  Tolerated well, no immediate complications   (including critical care  time)  Medications Ordered in ED Medications - No data to display   Initial Impression / Assessment and Plan / ED Course  I have reviewed the triage vital signs and the nursing notes. 32 y.o. Patient with toothache.  No gross abscess.  Exam unconcerning for Ludwig's angina or spread of infection.  Will treat with amoxicillin and anti-inflammatories medicine.  Urged patient to follow-up with dentist as scheduled.    Final Clinical Impressions(s) / ED Diagnoses   Final diagnoses:  Dental infection    ED Discharge Orders         Ordered    amoxicillin (AMOXIL) 500 MG capsule  3 times daily     06/24/18 1157           Damian Leavelleese, Airport Road AdditionHope M, NP 06/24/18 1626    Eber HongMiller, Brian, MD 06/25/18 2207

## 2018-06-24 NOTE — Discharge Instructions (Addendum)
Take the antibiotics and follow up with your dentist as scheduled this week.

## 2018-06-24 NOTE — ED Triage Notes (Signed)
Pt c/o pain at right lower jaw. No relief with ibuprofen and salt water gargle.c/o jaw swelling

## 2018-06-24 NOTE — ED Notes (Signed)
Declined W/C at D/C and was escorted to lobby by RN. 

## 2018-08-14 ENCOUNTER — Ambulatory Visit: Admit: 2018-08-14 | Payer: Self-pay | Admitting: Orthopedic Surgery

## 2018-08-14 ENCOUNTER — Ambulatory Visit (HOSPITAL_COMMUNITY)
Admission: EM | Admit: 2018-08-14 | Discharge: 2018-08-14 | Disposition: A | Discharge status: Home / Self Care | Payer: Worker's Compensation | Attending: Emergency Medicine | Admitting: Emergency Medicine

## 2018-08-14 ENCOUNTER — Encounter (HOSPITAL_COMMUNITY): Payer: Self-pay | Admitting: Emergency Medicine

## 2018-08-14 ENCOUNTER — Encounter (HOSPITAL_COMMUNITY)
Admission: EM | Disposition: A | Discharge status: Home / Self Care | Payer: Self-pay | Source: Home / Self Care | Attending: Emergency Medicine

## 2018-08-14 ENCOUNTER — Emergency Department (HOSPITAL_COMMUNITY): Payer: Worker's Compensation | Admitting: Certified Registered Nurse Anesthetist

## 2018-08-14 ENCOUNTER — Emergency Department (HOSPITAL_COMMUNITY): Payer: Worker's Compensation

## 2018-08-14 ENCOUNTER — Other Ambulatory Visit: Payer: Self-pay

## 2018-08-14 DIAGNOSIS — J45909 Unspecified asthma, uncomplicated: Secondary | ICD-10-CM | POA: Diagnosis not present

## 2018-08-14 DIAGNOSIS — Z87891 Personal history of nicotine dependence: Secondary | ICD-10-CM | POA: Diagnosis not present

## 2018-08-14 DIAGNOSIS — S67196A Crushing injury of right little finger, initial encounter: Secondary | ICD-10-CM | POA: Diagnosis present

## 2018-08-14 DIAGNOSIS — W240XXA Contact with lifting devices, not elsewhere classified, initial encounter: Secondary | ICD-10-CM | POA: Insufficient documentation

## 2018-08-14 DIAGNOSIS — S68626A Partial traumatic transphalangeal amputation of right little finger, initial encounter: Secondary | ICD-10-CM | POA: Diagnosis not present

## 2018-08-14 DIAGNOSIS — S68119A Complete traumatic metacarpophalangeal amputation of unspecified finger, initial encounter: Secondary | ICD-10-CM

## 2018-08-14 HISTORY — PX: AMPUTATION: SHX166

## 2018-08-14 SURGERY — AMPUTATION DIGIT
Anesthesia: Monitor Anesthesia Care | Site: Finger | Laterality: Right

## 2018-08-14 MED ORDER — PIPERACILLIN-TAZOBACTAM IN DEX 2-0.25 GM/50ML IV SOLN: 2.2500 g | Freq: Once | INTRAVENOUS | Status: DC

## 2018-08-14 MED ORDER — OXYCODONE-ACETAMINOPHEN 5-325 MG PO TABS: 1.0000 | ORAL_TABLET | ORAL | 0 refills | Status: AC | PRN

## 2018-08-14 MED ORDER — MIDAZOLAM HCL 2 MG/2ML IJ SOLN
INTRAMUSCULAR | Status: DC | PRN
  Administered 2018-08-14: 2 mg via INTRAVENOUS

## 2018-08-14 MED ORDER — PROPOFOL 500 MG/50ML IV EMUL
INTRAVENOUS | Status: DC | PRN
  Administered 2018-08-14: 95 ug/kg/min via INTRAVENOUS

## 2018-08-14 MED ORDER — LIDOCAINE HCL 1 % IJ SOLN
INTRAMUSCULAR | Status: AC
  Filled 2018-08-14: qty 20

## 2018-08-14 MED ORDER — LIDOCAINE HCL (PF) 1 % IJ SOLN
5.0000 mL | Freq: Once | INTRAMUSCULAR | Status: AC
  Administered 2018-08-14: 5 mL
  Filled 2018-08-14: qty 5

## 2018-08-14 MED ORDER — CEPHALEXIN 500 MG PO CAPS: 500.0000 mg | ORAL_CAPSULE | Freq: Four times a day (QID) | ORAL | 0 refills | Status: AC

## 2018-08-14 MED ORDER — SODIUM CHLORIDE 0.9 % IV BOLUS
1000.0000 mL | Freq: Once | INTRAVENOUS | Status: AC
Start: 2018-08-14 — End: 2018-08-14
  Administered 2018-08-14: 1000 mL via INTRAVENOUS

## 2018-08-14 MED ORDER — MIDAZOLAM HCL 2 MG/2ML IJ SOLN
INTRAMUSCULAR | Status: AC
  Filled 2018-08-14: qty 2

## 2018-08-14 MED ORDER — FENTANYL CITRATE (PF) 250 MCG/5ML IJ SOLN
INTRAMUSCULAR | Status: DC | PRN
  Administered 2018-08-14: 100 ug via INTRAVENOUS

## 2018-08-14 MED ORDER — CHLORHEXIDINE GLUCONATE 4 % EX LIQD: 60.0000 mL | Freq: Once | CUTANEOUS | Status: DC

## 2018-08-14 MED ORDER — HYDROMORPHONE HCL 1 MG/ML IJ SOLN
1.0000 mg | Freq: Once | INTRAMUSCULAR | Status: AC
  Administered 2018-08-14: 1 mg via INTRAVENOUS
  Filled 2018-08-14: qty 1

## 2018-08-14 MED ORDER — PROPOFOL 10 MG/ML IV BOLUS
INTRAVENOUS | Status: DC | PRN
  Administered 2018-08-14: 50 mg via INTRAVENOUS

## 2018-08-14 MED ORDER — PIPERACILLIN-TAZOBACTAM 3.375 G IVPB 30 MIN
3.3750 g | Freq: Once | INTRAVENOUS | Status: AC
  Administered 2018-08-14: 3.375 g via INTRAVENOUS
  Filled 2018-08-14: qty 50

## 2018-08-14 MED ORDER — CEFAZOLIN SODIUM-DEXTROSE 2-4 GM/100ML-% IV SOLN
2.0000 g | INTRAVENOUS | Status: AC
  Administered 2018-08-14: 2 g via INTRAVENOUS

## 2018-08-14 MED ORDER — PROPOFOL 10 MG/ML IV BOLUS
INTRAVENOUS | Status: AC
  Filled 2018-08-14: qty 20

## 2018-08-14 MED ORDER — LIDOCAINE HCL (PF) 1 % IJ SOLN
INTRAMUSCULAR | Status: DC | PRN
  Administered 2018-08-14: 5 mL

## 2018-08-14 MED ORDER — BUPIVACAINE HCL (PF) 0.25 % IJ SOLN
INTRAMUSCULAR | Status: AC
  Filled 2018-08-14: qty 10

## 2018-08-14 MED ORDER — 0.9 % SODIUM CHLORIDE (POUR BTL) OPTIME
TOPICAL | Status: DC | PRN
  Administered 2018-08-14: 1000 mL

## 2018-08-14 MED ORDER — POVIDONE-IODINE 10 % EX SWAB: 2.0000 "application " | Freq: Once | CUTANEOUS | Status: DC

## 2018-08-14 MED ORDER — LACTATED RINGERS IV SOLN
INTRAVENOUS | Status: DC
  Administered 2018-08-14 (×2): via INTRAVENOUS

## 2018-08-14 MED ORDER — LIDOCAINE 2% (20 MG/ML) 5 ML SYRINGE
INTRAMUSCULAR | Status: AC
  Filled 2018-08-14: qty 5

## 2018-08-14 MED ORDER — BUPIVACAINE HCL 0.25 % IJ SOLN
INTRAMUSCULAR | Status: DC | PRN
  Administered 2018-08-14: 5 mL

## 2018-08-14 MED ORDER — FENTANYL CITRATE (PF) 250 MCG/5ML IJ SOLN
INTRAMUSCULAR | Status: AC
  Filled 2018-08-14: qty 5

## 2018-08-14 MED ORDER — TETANUS-DIPHTH-ACELL PERTUSSIS 5-2.5-18.5 LF-MCG/0.5 IM SUSP
0.5000 mL | Freq: Once | INTRAMUSCULAR | Status: AC
  Administered 2018-08-14: 0.5 mL via INTRAMUSCULAR
  Filled 2018-08-14: qty 0.5

## 2018-08-14 SURGICAL SUPPLY — 45 items
BANDAGE ACE 3X5.8 VEL STRL LF (GAUZE/BANDAGES/DRESSINGS) IMPLANT
BANDAGE ACE 4X5 VEL STRL LF (GAUZE/BANDAGES/DRESSINGS) IMPLANT
BNDG COHESIVE 1X5 TAN STRL LF (GAUZE/BANDAGES/DRESSINGS) ×3 IMPLANT
BNDG CONFORM 2 STRL LF (GAUZE/BANDAGES/DRESSINGS) ×2 IMPLANT
BNDG ELASTIC 2X5.8 VLCR STR LF (GAUZE/BANDAGES/DRESSINGS) ×3 IMPLANT
BNDG GAUZE ELAST 4 BULKY (GAUZE/BANDAGES/DRESSINGS) ×3 IMPLANT
CORDS BIPOLAR (ELECTRODE) ×3 IMPLANT
COVER SURGICAL LIGHT HANDLE (MISCELLANEOUS) ×3 IMPLANT
COVER WAND RF STERILE (DRAPES) ×3 IMPLANT
CUFF TOURNIQUET SINGLE 18IN (TOURNIQUET CUFF) ×3 IMPLANT
CUFF TOURNIQUET SINGLE 24IN (TOURNIQUET CUFF) IMPLANT
DRAPE SURG 17X23 STRL (DRAPES) ×3 IMPLANT
DRSG ADAPTIC 3X8 NADH LF (GAUZE/BANDAGES/DRESSINGS) ×3 IMPLANT
GAUZE SPONGE 2X2 8PLY STRL LF (GAUZE/BANDAGES/DRESSINGS) IMPLANT
GAUZE SPONGE 4X4 12PLY STRL (GAUZE/BANDAGES/DRESSINGS) ×2 IMPLANT
GLOVE BIOGEL PI IND STRL 8.5 (GLOVE) ×1 IMPLANT
GLOVE BIOGEL PI INDICATOR 8.5 (GLOVE) ×2
GLOVE SURG ORTHO 8.0 STRL STRW (GLOVE) ×3 IMPLANT
GOWN STRL REUS W/ TWL LRG LVL3 (GOWN DISPOSABLE) ×2 IMPLANT
GOWN STRL REUS W/ TWL XL LVL3 (GOWN DISPOSABLE) ×1 IMPLANT
GOWN STRL REUS W/TWL LRG LVL3 (GOWN DISPOSABLE) ×6
GOWN STRL REUS W/TWL XL LVL3 (GOWN DISPOSABLE) ×3
KIT BASIN OR (CUSTOM PROCEDURE TRAY) ×3 IMPLANT
KIT TURNOVER KIT B (KITS) ×3 IMPLANT
MANIFOLD NEPTUNE II (INSTRUMENTS) ×3 IMPLANT
NDL HYPO 25GX1X1/2 BEV (NEEDLE) IMPLANT
NEEDLE HYPO 25GX1X1/2 BEV (NEEDLE) IMPLANT
NS IRRIG 1000ML POUR BTL (IV SOLUTION) ×3 IMPLANT
PACK ORTHO EXTREMITY (CUSTOM PROCEDURE TRAY) ×3 IMPLANT
PAD ARMBOARD 7.5X6 YLW CONV (MISCELLANEOUS) ×6 IMPLANT
PAD CAST 4YDX4 CTTN HI CHSV (CAST SUPPLIES) IMPLANT
PADDING CAST COTTON 4X4 STRL (CAST SUPPLIES)
SOAP 2 % CHG 4 OZ (WOUND CARE) ×3 IMPLANT
SPECIMEN JAR SMALL (MISCELLANEOUS) ×3 IMPLANT
SPONGE GAUZE 2X2 STER 10/PKG (GAUZE/BANDAGES/DRESSINGS) ×2
SUCTION FRAZIER HANDLE 10FR (MISCELLANEOUS)
SUCTION TUBE FRAZIER 10FR DISP (MISCELLANEOUS) IMPLANT
SUT MERSILENE 4 0 P 3 (SUTURE) IMPLANT
SUT PROLENE 4 0 PS 2 18 (SUTURE) ×2 IMPLANT
SYR CONTROL 10ML LL (SYRINGE) IMPLANT
TOWEL OR 17X24 6PK STRL BLUE (TOWEL DISPOSABLE) ×3 IMPLANT
TOWEL OR 17X26 10 PK STRL BLUE (TOWEL DISPOSABLE) ×3 IMPLANT
TUBE CONNECTING 12'X1/4 (SUCTIONS)
TUBE CONNECTING 12X1/4 (SUCTIONS) IMPLANT
WATER STERILE IRR 1000ML POUR (IV SOLUTION) ×3 IMPLANT

## 2018-08-14 NOTE — Consult Note (Addendum)
Reason for Consult:Right little finger amp Referring Physician: [name] is an 33 y.o. male.  HPI: Brett Gilbert was working today when his right little finger got caught between a forklift and dumpster, tearing the tip of it off. He came to the ED and hand surgery was consulted. X-rays confirmed an open fracture and abx were given. He is RHD.  Past Medical History:  Diagnosis Date  . Asthma     Past Surgical History:  Procedure Laterality Date  . KNEE SURGERY      Family History  Problem Relation Age of Onset  . Multiple sclerosis Mother     Social History:  reports that he quit smoking about 3 years ago. His smoking use included cigarettes. He has never used smokeless tobacco. He reports that he does not drink alcohol or use drugs.  Allergies: No Known Allergies  Medications: I have reviewed the patient's current medications.  No results found for this or any previous visit (from the past 48 hour(s)).  Dg Hand Complete Right  Result Date: 08/14/2018 CLINICAL DATA:  34 year old male with industrial accident EXAM: RIGHT HAND - COMPLETE 3+ VIEW COMPARISON:  None. FINDINGS: Traumatic amputation of the fifth digit of the right hand involving the distal phalanx. No additional fracture or radiopaque foreign body. IMPRESSION: Traumatic amputation of the fifth digit right hand at the distal phalanx. Electronically Signed   By: Gilmer Mor D.O.   On: 08/14/2018 15:31    Review of Systems  Constitutional: Negative for weight loss.  HENT: Negative for ear discharge, ear pain, hearing loss and tinnitus.   Eyes: Negative for blurred vision, double vision, photophobia and pain.  Respiratory: Negative for cough, sputum production and shortness of breath.   Cardiovascular: Negative for chest pain.  Gastrointestinal: Negative for abdominal pain, nausea and vomiting.  Genitourinary: Negative for dysuria, flank pain, frequency and urgency.  Musculoskeletal: Positive for joint pain  (Right little finger). Negative for back pain, falls, myalgias and neck pain.  Neurological: Negative for dizziness, tingling, sensory change, focal weakness, loss of consciousness and headaches.  Endo/Heme/Allergies: Does not bruise/bleed easily.  Psychiatric/Behavioral: Negative for depression, memory loss and substance abuse. The patient is not nervous/anxious.    Blood pressure (!) 149/84, pulse (!) 102, temperature 97.9 F (36.6 C), resp. rate 20, height 6\' 2"  (1.88 m), weight 70.3 kg, SpO2 100 %. Physical Exam  Constitutional: He appears well-developed and well-nourished. No distress.  HENT:  Head: Normocephalic and atraumatic.  Eyes: Conjunctivae are normal. Right eye exhibits no discharge. Left eye exhibits no discharge. No scleral icterus.  Neck: Normal range of motion.  Cardiovascular: Normal rate and regular rhythm.  Respiratory: Effort normal. No respiratory distress.  Musculoskeletal:     Comments: Right shoulder, elbow, wrist, digits- amputation P3 little finger proximal to nail, mod TTP, no instability, no blocks to motion  Sens  Ax/R/M/U intact  Mot   Ax/ R/ PIN/ M/ AIN/ U intact  Rad 2+  Neurological: He is alert.  Skin: Skin is warm and dry. He is not diaphoretic.  Psychiatric: He has a normal mood and affect. His behavior is normal.    Assessment/Plan: Right little finger partial amputation -- Plan for revision amputation by Dr. Melvyn Novas this afternoon. NPO until then. Anticipate discharge after surgery.  Plan for revision amputation and debridement as indicated  R/B/A DISCUSSED WITH PT IN HOSPITAL.  PT VOICED UNDERSTANDING OF PLAN CONSENT SIGNED DAY OF SURGERY PT SEEN AND EXAMINED PRIOR TO OPERATIVE PROCEDURE/DAY  OF SURGERY SITE MARKED. QUESTIONS ANSWERED WILL GO HOME FOLLOWING SURGERY  WE ARE PLANNING SURGERY FOR YOUR UPPER EXTREMITY. THE RISKS AND BENEFITS OF SURGERY INCLUDE BUT NOT LIMITED TO BLEEDING INFECTION, DAMAGE TO NEARBY NERVES ARTERIES TENDONS,  FAILURE OF SURGERY TO ACCOMPLISH ITS INTENDED GOALS, PERSISTENT SYMPTOMS AND NEED FOR FURTHER SURGICAL INTERVENTION. WITH THIS IN MIND WE WILL PROCEED. I HAVE DISCUSSED WITH THE PATIENT THE PRE AND POSTOPERATIVE REGIMEN AND THE DOS AND DON'TS. PT VOICED UNDERSTANDING AND INFORMED CONSENT SIGNED.    Freeman Caldron, PA-C Orthopedic Surgery [phone] 08/14/2018, 3:41 PM

## 2018-08-14 NOTE — ED Notes (Signed)
Pt belongings sent with family 

## 2018-08-14 NOTE — ED Notes (Signed)
Surgery at bedside.

## 2018-08-14 NOTE — Anesthesia Preprocedure Evaluation (Signed)
Anesthesia Evaluation  Patient identified by MRN, date of birth, ID band Patient awake    Reviewed: Allergy & Precautions, NPO status , Patient's Chart, lab work & pertinent test results  Airway Mallampati: I  TM Distance: >3 FB Neck ROM: Full    Dental   Pulmonary former smoker,    Pulmonary exam normal        Cardiovascular Normal cardiovascular exam     Neuro/Psych    GI/Hepatic   Endo/Other    Renal/GU      Musculoskeletal   Abdominal   Peds  Hematology   Anesthesia Other Findings   Reproductive/Obstetrics                             Anesthesia Physical Anesthesia Plan  ASA: II and emergent  Anesthesia Plan: MAC   Post-op Pain Management:    Induction: Intravenous  PONV Risk Score and Plan: 1  Airway Management Planned: Simple Face Mask  Additional Equipment:   Intra-op Plan:   Post-operative Plan:   Informed Consent: I have reviewed the patients History and Physical, chart, labs and discussed the procedure including the risks, benefits and alternatives for the proposed anesthesia with the patient or authorized representative who has indicated his/her understanding and acceptance.     Plan Discussed with: CRNA and Surgeon  Anesthesia Plan Comments:         Anesthesia Quick Evaluation

## 2018-08-14 NOTE — Op Note (Signed)
PREOPERATIVE DIAGNOSIS:right small finger open distal phalanx fracture  POSTOPERATIVE DIAGNOSIS:same  ATTENDING SURGEON:Dr. Gilman Schmidt who was scrubbed and present for the entire procedure  ASSISTANT SURGEON:none  ANESTHESIA:1% Xylocaine core percent Marcaine local block with IV sedation  OPERATIVE PROCEDURE:#1: Open treatment right small finger open distal phalanx fracture with local debridement of skin subcutaneous tissue and bone #2: Right small finger amputation through the level of the distal interphalangeal joint with local neurectomies and rotation flap closure  IMPLANTS:none  RADIOGRAPHIC INTERPRETATION:none  SURGICAL INDICATIONS:patient is a right-hand-dominant gentleman who got his right small finger in a crushing injury indicating the distal portion of the distal phalanx. Patient seen and evaluated in the ER and recommended undergo the above procedure. Risks of surgery include but not limited to bleeding infection damage to nearby nerves arteries or tendons loss of motion of the wrists and digits incomplete relief of symptoms and need for further surgical intervention.  SURGICAL TECHNIQUE:patient is properly identified in the preoperative holding area marked for permanent marker made on the right small finger to indicate correct operative site. Patient brought back Room placed supine on anesthesia and table with the IV sedation was administered the local anesthetic was administered. A well-padded tourniquet was then placed on the right brachium and sealed with the appropriate drape. The right upper extremity then prepped and draped in normal sterile fashion. Timeout was called the correct site was identified and the procedure then begun. Attention then turned to the small finger excisional debridement of the devitalized tissue was then carried out sharply with knife and small scissors. Excisional debridement of skin subcutaneous tissue and bone was then carried out. Following this  given the level of amputation and traumatic injury amputation was then completed to the level of the distal interphalangeal joint removing the distal phalanx. The flexor digitorum profundus was allowed to retract proximally. Local neurectomies and then carried out of the radial digital and ulnar digital nerves. The wound was then thoroughly irrigated. Following this advancement flap closure rotational flap closure was then carried out closing the skin from a dorsal to volar direction with simple Prolene sutures. Adaptic dressing a sterile compressive bandages applied. The patient tolerated the procedure well. Patient was then taken recovery room in good condition.  POSTOPERATIVE PLAN:patient be discharged to home. Seen back now office in 8 days for wound check down to see her therapist for a tip protector. Seen back in 3 week mark for suture removal. Radiographs the first visit.

## 2018-08-14 NOTE — ED Notes (Signed)
Called hand surgery Per Pa Joselyn Glassman

## 2018-08-14 NOTE — ED Triage Notes (Signed)
Pt from work. Pt got his right pinky finger wedged between a pallet and the ground when trying to move pallet.  Pt is lost the tip of his pinky finger from his distal joint to nail it appears.  NAD at this time pt is AOx4

## 2018-08-14 NOTE — Discharge Instructions (Signed)
KEEP BANDAGE CLEAN AND DRY °CALL OFFICE FOR F/U APPT [phone] in 8 days °KEEP HAND ELEVATED ABOVE HEART °OK TO APPLY ICE TO OPERATIVE AREA °CONTACT OFFICE IF ANY WORSENING PAIN OR CONCERNS. °

## 2018-08-14 NOTE — Anesthesia Procedure Notes (Signed)
Procedure Name: MAC Date/Time: 08/14/2018 7:35 AM Performed by: Leonor Liv, CRNA Pre-anesthesia Checklist: Patient identified, Emergency Drugs available, Suction available and Patient being monitored Patient Re-evaluated:Patient Re-evaluated prior to induction Oxygen Delivery Method: Simple face mask Placement Confirmation: positive ETCO2 Dental Injury: Teeth and Oropharynx as per pre-operative assessment

## 2018-08-14 NOTE — Transfer of Care (Signed)
Immediate Anesthesia Transfer of Care Note  Patient: Brett Gilbert  Procedure(s) Performed: AMPUTATION OF FINGER (Right Finger)  Patient Location: PACU  Anesthesia Type:MAC combined with regional for post-op pain  Level of Consciousness: sedated  Airway & Oxygen Therapy: Patient Spontanous Breathing and Patient connected to face mask oxygen  Post-op Assessment: Report given to RN and Post -op Vital signs reviewed and stable  Post vital signs: Reviewed and stable  Last Vitals:  Vitals Value Taken Time  BP 101/55 08/14/2018  6:15 PM  Temp 36.5 C 08/14/2018  6:15 PM  Pulse 73 08/14/2018  6:21 PM  Resp 13 08/14/2018  6:21 PM  SpO2 100 % 08/14/2018  6:21 PM  Vitals shown include unvalidated device data.  Last Pain:  Vitals:   08/14/18 1815  TempSrc:   PainSc: Asleep      Patients Stated Pain Goal: 3 (62/95/28 4132)  Complications: No apparent anesthesia complications

## 2018-08-14 NOTE — Anesthesia Postprocedure Evaluation (Signed)
Anesthesia Post Note  Patient: Brett Gilbert  Procedure(s) Performed: AMPUTATION OF FINGER (Right Finger)     Patient location during evaluation: PACU Anesthesia Type: MAC Level of consciousness: awake and alert Pain management: pain level controlled Vital Signs Assessment: post-procedure vital signs reviewed and stable Respiratory status: spontaneous breathing, nonlabored ventilation, respiratory function stable and patient connected to nasal cannula oxygen Cardiovascular status: stable and blood pressure returned to baseline Postop Assessment: no apparent nausea or vomiting Anesthetic complications: no    Last Vitals:  Vitals:   08/14/18 1411 08/14/18 1815  BP:  (!) 101/55  Pulse:  73  Resp:  (!) 8  Temp: 36.6 C 36.5 C  SpO2:  98%    Last Pain:  Vitals:   08/14/18 1815  TempSrc:   PainSc: [place] DAVID

## 2018-08-14 NOTE — ED Provider Notes (Signed)
[name] Provider Note   CSN: [id number] Arrival date & time: 08/14/18  1404     History   Chief Complaint Chief Complaint  Patient presents with  . Finger Injury    HPI Brett Gilbert is a 33 y.o. male.  HPI 33 year old male with no pertinent past medical history presents to the ED for evaluation of amputation of the distal right pinky finger.  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  Patient states this happened approximately 1329.  Unknown last tetanus shot.  Reports pain with range of motion.  Does have sensation and radial pulses intact.  Wound was bandaged by EMS.  Patient denies any other associated injuries from the accident today. Past Medical History:  Diagnosis Date  . Asthma     There are no active problems to display for this patient.   Past Surgical History:  Procedure Laterality Date  . KNEE SURGERY          Home Medications    Prior to Admission medications   Medication Sig Start Date End Date Taking? Authorizing Provider  albuterol (PROVENTIL HFA;VENTOLIN HFA) 108 (90 Base) MCG/ACT inhaler Inhale 1-2 puffs into the lungs every 6 (six) hours as needed for wheezing or shortness of breath. 09/15/17   Khatri, Hina, PA-C  amoxicillin (AMOXIL) 500 MG capsule Take 1 capsule (500 mg total) by mouth 3 (three) times daily. 06/24/18   Janne NapoleonNeese, Hope M, NP    Family History Family History  Problem Relation Age of Onset  . Multiple sclerosis Mother     Social History Social History   Tobacco Use  . Smoking status: Former Smoker    Types: Cigarettes    Last attempt to quit: 01/06/2015    Years since quitting: 3.6  . Smokeless tobacco: Never Used  Substance Use Topics  . Alcohol use: No  . Drug use: No     Allergies   Patient has no known allergies.   Review of Systems Review of Systems    Constitutional: Negative for fever.  Eyes: Negative for discharge.  Gastrointestinal: Negative for vomiting.  Musculoskeletal: Positive for arthralgias and myalgias.  Skin: Positive for wound.  Neurological: Negative for weakness and numbness.     Physical Exam Updated Vital Signs BP (!) 149/84 (BP Location: Right Arm)   Pulse (!) 102   Temp 97.9 F (36.6 C)   Resp 20   Ht 6\' 2"  (1.88 m)   Wt 70.3 kg   SpO2 100%   BMI 19.90 kg/m   Physical Exam Vitals signs and nursing note reviewed.  Constitutional:      General: He is not in acute distress.    Appearance: He is well-developed.  HENT:     Head: Normocephalic and atraumatic.  Eyes:     General: No scleral icterus.       Right eye: No discharge.        Left eye: No discharge.  Neck:     Musculoskeletal: Normal range of motion.  Pulmonary:     Effort: No respiratory distress.  Musculoskeletal:     Comments: Patient with approximately 3 cm amputation from the distal aspect of the right pinky finger.  There is exposed bone.  The nail and nailbed was completely amputated as well.  Patient does have range of motion of the PIP limited movement of the dip.  He does have sensation intact.  Radial pulses are 2+ bilaterally.  Bleeding is controlled at this time.  There is no significant foreign body or debris noted on my examination.  Wound was redressed.  Skin:    General: Skin is warm and dry.     Capillary Refill: Capillary refill takes less than 2 seconds.     Coloration: Skin is not pale.  Neurological:     Mental Status: He is alert.  Psychiatric:        Behavior: Behavior normal.        Thought Content: Thought content normal.        Judgment: Judgment normal.          ED Treatments / Results  Labs (all labs ordered are listed, but only abnormal results are displayed) Labs Reviewed - No data to display  EKG None  Radiology No results found.  Procedures Procedures (including critical care  time)  Medications Ordered in ED Medications  HYDROmorphone (DILAUDID) injection 1 mg (has no administration in time range)  sodium chloride 0.9 % bolus 1,000 mL (has no administration in time range)  Tdap (BOOSTRIX) injection 0.5 mL (has no administration in time range)  piperacillin-tazobactam (ZOSYN) IVPB 2.25 g (has no administration in time range)     Initial Impression / Assessment and Plan / ED Course  I have reviewed the triage vital signs and the nursing notes.  Pertinent labs & imaging results that were available during my care of the patient were reviewed by me and considered in my medical decision making (see chart for details).     Patient presents to the ED for evaluation of amputation to the left pinky finger.  Patient neurovascular intact.  Bleeding controlled.  Tetanus updated.  Patient given IV Zosyn. Xray pending at this time. Dicussed with Charma Igo with hand Driscilla Grammes who will evaluate pt. Pt signed out to my attending Dr. Pilar Plate who evaluated pt and is agreeable with the above plan. Pt hds at this time.  Final Clinical Impressions(s) / ED Diagnoses   Final diagnoses:  Traumatic amputation of finger, initial encounter    ED Discharge Orders    None       Wallace Keller 08/14/18 1537    Sabas Sous, MD 08/16/18 1308

## 2018-08-15 ENCOUNTER — Encounter (HOSPITAL_COMMUNITY): Payer: Self-pay | Admitting: Orthopedic Surgery

## 2018-08-16 DIAGNOSIS — S62639B Displaced fracture of distal phalanx of unspecified finger, initial encounter for open fracture: Secondary | ICD-10-CM | POA: Insufficient documentation

## 2018-09-25 ENCOUNTER — Encounter (HOSPITAL_COMMUNITY): Payer: Self-pay | Admitting: *Deleted

## 2018-09-25 ENCOUNTER — Emergency Department (HOSPITAL_COMMUNITY)
Admission: EM | Admit: 2018-09-25 | Discharge: 2018-09-25 | Discharge status: Against Medical Advice | Payer: Self-pay | Attending: Emergency Medicine | Admitting: Emergency Medicine

## 2018-09-25 DIAGNOSIS — Z5321 Procedure and treatment not carried out due to patient leaving prior to being seen by health care provider: Secondary | ICD-10-CM | POA: Insufficient documentation

## 2018-09-25 MED ORDER — ALBUTEROL SULFATE (2.5 MG/3ML) 0.083% IN NEBU
5.0000 mg | INHALATION_SOLUTION | Freq: Once | RESPIRATORY_TRACT | Status: AC
  Administered 2018-09-25: 5 mg via RESPIRATORY_TRACT
  Filled 2018-09-25: qty 6

## 2018-09-25 NOTE — ED Notes (Signed)
Called pt for room no answer 

## 2018-09-25 NOTE — ED Triage Notes (Signed)
Pt in c/o asthma exhasherbation, out of inhaler, speaking in full sentences, expiratory wheezing noted

## 2018-11-08 DIAGNOSIS — M79644 Pain in right finger(s): Secondary | ICD-10-CM | POA: Insufficient documentation

## 2018-11-14 ENCOUNTER — Emergency Department (HOSPITAL_COMMUNITY)
Admission: EM | Admit: 2018-11-14 | Discharge: 2018-11-14 | Disposition: A | Discharge status: Home / Self Care | Payer: Self-pay | Attending: Emergency Medicine | Admitting: Emergency Medicine

## 2018-11-14 ENCOUNTER — Emergency Department (HOSPITAL_COMMUNITY): Payer: Self-pay

## 2018-11-14 ENCOUNTER — Encounter (HOSPITAL_COMMUNITY): Payer: Self-pay | Admitting: Emergency Medicine

## 2018-11-14 DIAGNOSIS — J45901 Unspecified asthma with (acute) exacerbation: Secondary | ICD-10-CM | POA: Insufficient documentation

## 2018-11-14 DIAGNOSIS — Z79899 Other long term (current) drug therapy: Secondary | ICD-10-CM | POA: Insufficient documentation

## 2018-11-14 DIAGNOSIS — Z87891 Personal history of nicotine dependence: Secondary | ICD-10-CM | POA: Insufficient documentation

## 2018-11-14 MED ORDER — FENTANYL CITRATE (PF) 100 MCG/2ML IJ SOLN
INTRAMUSCULAR | Status: AC
  Filled 2018-11-14: qty 2

## 2018-11-14 MED ORDER — IPRATROPIUM BROMIDE HFA 17 MCG/ACT IN AERS
2.0000 | INHALATION_SPRAY | Freq: Once | RESPIRATORY_TRACT | Status: AC
  Administered 2018-11-14: 2 via RESPIRATORY_TRACT
  Filled 2018-11-14: qty 12.9

## 2018-11-14 MED ORDER — IPRATROPIUM BROMIDE HFA 17 MCG/ACT IN AERS: 2.0000 | INHALATION_SPRAY | Freq: Once | RESPIRATORY_TRACT | Status: DC

## 2018-11-14 MED ORDER — METHYLPREDNISOLONE SODIUM SUCC 125 MG IJ SOLR
125.0000 mg | Freq: Once | INTRAMUSCULAR | Status: AC
  Administered 2018-11-14: 125 mg via INTRAVENOUS
  Filled 2018-11-14: qty 2

## 2018-11-14 MED ORDER — ALBUTEROL SULFATE HFA 108 (90 BASE) MCG/ACT IN AERS
6.0000 | INHALATION_SPRAY | Freq: Once | RESPIRATORY_TRACT | Status: AC
  Administered 2018-11-14: 08:00:00 6 via RESPIRATORY_TRACT
  Filled 2018-11-14: qty 6.7

## 2018-11-14 MED ORDER — PREDNISONE 10 MG PO TABS: 40.0000 mg | ORAL_TABLET | Freq: Every day | ORAL | 0 refills | Status: AC

## 2018-11-14 MED ORDER — MAGNESIUM SULFATE 2 GM/50ML IV SOLN
2.0000 g | Freq: Once | INTRAVENOUS | Status: AC
  Administered 2018-11-14: 2 g via INTRAVENOUS
  Filled 2018-11-14: qty 50

## 2018-11-14 MED ORDER — PREDNISONE 20 MG PO TABS
60.0000 mg | ORAL_TABLET | Freq: Once | ORAL | Status: AC
  Administered 2018-11-14: 60 mg via ORAL
  Filled 2018-11-14: qty 3

## 2018-11-14 MED ORDER — ALBUTEROL SULFATE HFA 108 (90 BASE) MCG/ACT IN AERS: 1.0000 | INHALATION_SPRAY | RESPIRATORY_TRACT | 0 refills | Status: DC | PRN

## 2018-11-14 NOTE — ED Triage Notes (Signed)
Pt states he woke up suddenly with difficultly breathing and wheezing- history of asthma states he used his inhaler with no relief. [employer] department gave pt a neb treatment and ems gave pt albuterol inhaler. sats were 88% on room air after treatments and inhaler 97% on room air on arrival to ED. Pt is alert and ox4.

## 2018-11-14 NOTE — ED Provider Notes (Signed)
Medical screening examination/treatment/procedure(s) were conducted as a shared visit with non-physician practitioner(s) and myself.  I personally evaluated the patient during the encounter.  EKG Interpretation  Date/Time:  Thursday November 14 2018 07:21:50 EDT Ventricular Rate:  111 PR Interval:    QRS Duration: 88 QT Interval:  292 QTC Calculation: 397 R Axis:   95 Text Interpretation:  Sinus tachycardia Multiple ventricular premature complexes Biatrial enlargement Borderline right axis deviation Repol abnrm suggests ischemia, inferior leads Artifact in lead(s) I II III aVR aVL aVF V1 V2 V3 V4 V5 artifact no change from previous Confirmed by Arby Barrette [phone]) on 11/14/2018 7:48:46 AM Patient reports he essentially woke up with an asthma attack this morning.  Started about 3 AM.  He tried his inhalers without any significant relief.  Patient reports history of asthma since childhood.  He has never required intubation or overnight hospitalization.  Reports he has had several emergency department visits for treatment.  Patient has significant increased work of breathing.  He is alert and appropriate.  Diminished breath sounds in the bases.  Tight expiratory wheeze.  Heart tachycardic.  Abdomen soft nontender.  Normal mental status.  Multiple rechecks for response to treatment.  Patient continued to incrementally show improvement.  He did require treatment with magnesium, Solu-Medrol and multiple uses of albuterol inhaler.  Ultimately, patient did show improvement and I felt stable for discharge with follow-up and return precautions reviewed.   Arby Barrette, MD 11/20/18 [id number]

## 2018-11-14 NOTE — ED Notes (Signed)
ED Provider at bedside. 

## 2018-11-14 NOTE — ED Provider Notes (Signed)
[facility] EMERGENCY DEPARTMENT Provider Note   CSN: [id number] Arrival date & time: 11/14/18  4098    History   Chief Complaint Chief Complaint  Patient presents with  . Asthma    HPI Brett Gilbert is a 33 y.o. male.     Patient is a 33 year old male with past medical history of asthma who presents the emergency department for shortness of breath and asthma exacerbation.  Patient brought in by EMS.  Patient working heavy to breathe.  Reports that his asthma seemed to become worse about an hour ago.  Albuterol inhaler at home not helping.  Denies any fever, chills, sick contacts, recent travel.     Past Medical History:  Diagnosis Date  . Asthma     There are no active problems to display for this patient.   Past Surgical History:  Procedure Laterality Date  . AMPUTATION Right 08/14/2018   Procedure: AMPUTATION OF FINGER;  Surgeon: Bradly Bienenstock, MD;  Location: [facility] OR;  Service: Orthopedics;  Laterality: Right;  . KNEE SURGERY          Home Medications    Prior to Admission medications   Medication Sig Start Date End Date Taking? Authorizing Provider  albuterol (PROVENTIL HFA;VENTOLIN HFA) 108 (90 Base) MCG/ACT inhaler Inhale 1-2 puffs into the lungs every 4 (four) hours as needed for up to 30 days for wheezing or shortness of breath. 11/14/18 12/14/18  Arlyn Dunning, PA-C  oxyCODONE-acetaminophen (PERCOCET) 5-325 MG tablet Take 1 tablet by mouth every 4 (four) hours as needed for severe pain. Patient not taking: Reported on 11/14/2018 08/14/18 08/14/19  Bradly Bienenstock, MD  predniSONE (DELTASONE) 10 MG tablet Take 4 tablets (40 mg total) by mouth daily for 5 days. 11/14/18 11/19/18  Arlyn Dunning, PA-C    Family History Family History  Problem Relation Age of Onset  . Multiple sclerosis Mother     Social History Social History   Tobacco Use  . Smoking status: Former Smoker    Types: Cigarettes    Last attempt to quit: 01/06/2015    Years since  quitting: 3.8  . Smokeless tobacco: Never Used  Substance Use Topics  . Alcohol use: No  . Drug use: No     Allergies   Patient has no known allergies.   Review of Systems Review of Systems  Reason unable to perform ROS: Limited due to acuity of condition.  Patient difficulty breathing and talking.  Constitutional: Negative for chills and fever.  HENT: Negative for congestion and sore throat.   Respiratory: Positive for cough and shortness of breath.   Gastrointestinal: Negative for nausea and vomiting.     Physical Exam Updated Vital Signs Pulse 92   Temp 99 F (37.2 C) (Oral)   Resp (!) 27   SpO2 95%   Physical Exam Vitals signs and nursing note reviewed.  Constitutional:      General: He is in acute distress.     Appearance: He is normal weight.  HENT:     Head: Normocephalic.     Mouth/Throat:     Pharynx: Oropharynx is clear.  Eyes:     Conjunctiva/sclera: Conjunctivae normal.  Cardiovascular:     Rate and Rhythm: Tachycardia present.  Pulmonary:     Comments: Patient is tachypneic with shallow breaths.  Diffuse high-pitched bilateral wheezing throughout. Speaking only single sentence words. Skin:    General: Skin is dry.  Neurological:     Mental Status: He is alert.  Psychiatric:        Mood and Affect: Mood normal.      ED Treatments / Results  Labs (all labs ordered are listed, but only abnormal results are displayed) Labs Reviewed - No data to display  EKG EKG Interpretation  Date/Time:  Thursday November 14 2018 07:21:50 EDT Ventricular Rate:  111 PR Interval:    QRS Duration: 88 QT Interval:  292 QTC Calculation: 397 R Axis:   95 Text Interpretation:  Sinus tachycardia Multiple ventricular premature complexes Biatrial enlargement Borderline right axis deviation Repol abnrm suggests ischemia, inferior leads Artifact in lead(s) I II III aVR aVL aVF V1 V2 V3 V4 V5 artifact no change from previous Confirmed by Arby Barrette [id number]) on  11/14/2018 7:48:46 AM   Radiology Dg Chest Portable 1 View  Result Date: 11/14/2018 CLINICAL DATA:  Asthma with shortness of breath. EXAM: PORTABLE CHEST 1 VIEW COMPARISON:  02/11/2017. FINDINGS: The heart size and mediastinal contours are within normal limits. Both lungs are clear. The visualized skeletal structures are unremarkable. Hyperinflation. No pneumothorax. Similar appearance to priors. IMPRESSION: No active disease. Electronically Signed   By: Elsie Stain M.D.   On: 11/14/2018 08:44    Procedures Procedures (including critical care time)  Medications Ordered in ED Medications  albuterol (PROVENTIL HFA;VENTOLIN HFA) 108 (90 Base) MCG/ACT inhaler 6 puff (6 puffs Inhalation Given 11/14/18 0730)  methylPREDNISolone sodium succinate (SOLU-MEDROL) 125 mg/2 mL injection 125 mg (125 mg Intravenous Given 11/14/18 0745)  magnesium sulfate IVPB 2 g 50 mL (0 g Intravenous Stopped 11/14/18 0848)  ipratropium (ATROVENT HFA) inhaler 2 puff (2 puffs Inhalation Given 11/14/18 0839)  predniSONE (DELTASONE) tablet 60 mg (60 mg Oral Given 11/14/18 1139)     Initial Impression / Assessment and Plan / ED Course  I have reviewed the triage vital signs and the nursing notes.  Pertinent labs & imaging results that were available during my care of the patient were reviewed by me and considered in my medical decision making (see chart for details).  Clinical Course as of Nov 14 724  Thu Nov 14, 2018  3567 Patient with acute asthma exacerbation.  History of asthma.  Was given 1 DuoNeb and 2 puffs of albuterol inhaler by EMS prior to arrival.  Patient still working very heavily to breathe.  Albuterol inhaler in process.  We will also order IV Solu-Medrol and IV magnesium at this time.   [KM]  [phone] Dr. Clarice Pole also in to see patient.    [KM]  [phone] Patient is improved with albuterol inhaler.  He has gotten Solu-Medrol and is currently getting magnesium infusion.  Patient able to speak full sentences but still has  tachypnea and increased work of breathing.  Currently awaiting chest x-ray as well as ipratropium inhaler   [KM]  [phone] Continuing to monitor patient.  Appears improved again.  Discussed with respiratory about continuous nebulizer treatment.  Respiratory states that due to the COVID-19 outbreak, they are not doing any nebulizer treatments on any patients, even those who are not displaying symptoms.  Since he is improved, will continue to monitor   [KM]  1147 Patient reports that he is feeling much better now.  Reports that he would like to go home.  Reports that he feels almost at his baseline.  Patient still has bilateral wheezing but is moving air much better than before.  Patient was advised how important it is for him to continue taking his albuterol inhaler and steroids and when to return to  the emergency department.  I discussed admission with him but he declined admission.   [KM]    Clinical Course User Index [KM] Arlyn DunningMcLean, Harman Ferrin A, PA-C         Final Clinical Impressions(s) / ED Diagnoses   Final diagnoses:  Severe asthma with exacerbation, unspecified whether persistent    ED Discharge Orders         Ordered    albuterol (PROVENTIL HFA;VENTOLIN HFA) 108 (90 Base) MCG/ACT inhaler  Every 4 hours PRN     11/14/18 1152    predniSONE (DELTASONE) 10 MG tablet  Daily     11/14/18 1152           Jeral PinchMcLean, Iyana Topor A, PA-C 11/15/18 0726    Arby BarrettePfeiffer, Marcy, MD 11/20/18 2339

## 2018-11-14 NOTE — Discharge Instructions (Signed)
Thank you for allowing me to care for you today. Please return to the emergency department if you have new or worsening symptoms. Take your medications as instructed.  ° °

## 2018-11-14 NOTE — ED Notes (Signed)
Per provider request and verbal order pt will received albuterol and Atrovent inhaler 2 puffs Q 15 x 2. Given dose now and will adminster again at 0900. Pt reports feeling like his breathing has improved.

## 2019-04-17 DIAGNOSIS — Z4789 Encounter for other orthopedic aftercare: Secondary | ICD-10-CM | POA: Insufficient documentation

## 2020-05-16 ENCOUNTER — Emergency Department (HOSPITAL_COMMUNITY): Payer: No Typology Code available for payment source

## 2020-05-16 ENCOUNTER — Emergency Department (HOSPITAL_COMMUNITY)
Admission: EM | Admit: 2020-05-16 | Discharge: 2020-05-16 | Disposition: A | Discharge status: Home / Self Care | Payer: No Typology Code available for payment source | Attending: Emergency Medicine | Admitting: Emergency Medicine

## 2020-05-16 DIAGNOSIS — T1490XA Injury, unspecified, initial encounter: Secondary | ICD-10-CM

## 2020-05-16 DIAGNOSIS — S39012A Strain of muscle, fascia and tendon of lower back, initial encounter: Secondary | ICD-10-CM

## 2020-05-16 DIAGNOSIS — T148XXA Other injury of unspecified body region, initial encounter: Secondary | ICD-10-CM

## 2020-05-16 DIAGNOSIS — S025XXA Fracture of tooth (traumatic), initial encounter for closed fracture: Secondary | ICD-10-CM

## 2020-05-16 DIAGNOSIS — S00511A Abrasion of lip, initial encounter: Secondary | ICD-10-CM | POA: Insufficient documentation

## 2020-05-16 DIAGNOSIS — M549 Dorsalgia, unspecified: Secondary | ICD-10-CM | POA: Diagnosis not present

## 2020-05-16 DIAGNOSIS — Z23 Encounter for immunization: Secondary | ICD-10-CM | POA: Diagnosis not present

## 2020-05-16 DIAGNOSIS — M542 Cervicalgia: Secondary | ICD-10-CM | POA: Diagnosis not present

## 2020-05-16 DIAGNOSIS — S0031XA Abrasion of nose, initial encounter: Secondary | ICD-10-CM | POA: Insufficient documentation

## 2020-05-16 DIAGNOSIS — Y92410 Unspecified street and highway as the place of occurrence of the external cause: Secondary | ICD-10-CM | POA: Insufficient documentation

## 2020-05-16 DIAGNOSIS — S0081XA Abrasion of other part of head, initial encounter: Secondary | ICD-10-CM | POA: Diagnosis present

## 2020-05-16 LAB — COMPREHENSIVE METABOLIC PANEL
ALT: 26 U/L (ref 0–44)
AST: 28 U/L (ref 15–41)
Albumin: 3.6 g/dL (ref 3.5–5.0)
Alkaline Phosphatase: 49 U/L (ref 38–126)
Anion gap: 12 (ref 5–15)
BUN: 10 mg/dL (ref 6–20)
CO2: 22 mmol/L (ref 22–32)
Calcium: 9.1 mg/dL (ref 8.9–10.3)
Chloride: 108 mmol/L (ref 98–111)
Creatinine, Ser: 1.42 mg/dL — ABNORMAL HIGH (ref 0.61–1.24)
GFR, Estimated: >60 mL/min (ref >60)
Glucose, Bld: 130 mg/dL — ABNORMAL HIGH (ref 70–99)
Potassium: 3.7 mmol/L (ref 3.5–5.1)
Sodium: 142 mmol/L (ref 135–145)
Total Bilirubin: 0.5 mg/dL (ref 0.3–1.2)
Total Protein: 6.1 g/dL — ABNORMAL LOW (ref 6.5–8.1)

## 2020-05-16 LAB — I-STAT CHEM 8, ED
BUN: 12 mg/dL (ref 6–20)
Calcium, Ion: 1.11 mmol/L — ABNORMAL LOW (ref 1.15–1.40)
Chloride: 107 mmol/L (ref 98–111)
Creatinine, Ser: 1.3 mg/dL — ABNORMAL HIGH (ref 0.61–1.24)
Glucose, Bld: 121 mg/dL — ABNORMAL HIGH (ref 70–99)
HCT: 40 % (ref 39.0–52.0)
Hemoglobin: 13.6 g/dL (ref 13.0–17.0)
Potassium: 3.6 mmol/L (ref 3.5–5.1)
Sodium: 142 mmol/L (ref 135–145)
TCO2: 21 mmol/L — ABNORMAL LOW (ref 22–32)

## 2020-05-16 LAB — CBC
HCT: 41.1 % (ref 39.0–52.0)
Hemoglobin: 13.7 g/dL (ref 13.0–17.0)
MCH: 28.1 pg (ref 26.0–34.0)
MCHC: 33.3 g/dL (ref 30.0–36.0)
MCV: 84.2 fL (ref 80.0–100.0)
Platelets: 392 10*3/uL (ref 150–400)
RBC: 4.88 MIL/uL (ref 4.22–5.81)
RDW: 13.1 % (ref 11.5–15.5)
WBC: 8.5 10*3/uL (ref 4.0–10.5)
nRBC: 0 % (ref 0.0–0.2)

## 2020-05-16 LAB — LACTIC ACID, PLASMA: Lactic Acid, Venous: 3.9 mmol/L (ref 0.5–1.9)

## 2020-05-16 LAB — SAMPLE TO BLOOD BANK

## 2020-05-16 LAB — ETHANOL: Alcohol, Ethyl (B): <10 mg/dL (ref <10)

## 2020-05-16 LAB — PROTIME-INR
INR: 1 (ref 0.8–1.2)
Prothrombin Time: 12.9 seconds (ref 11.4–15.2)

## 2020-05-16 MED ORDER — FENTANYL CITRATE (PF) 100 MCG/2ML IJ SOLN: 50.0000 ug | INTRAMUSCULAR | Status: DC | PRN

## 2020-05-16 MED ORDER — FENTANYL CITRATE (PF) 100 MCG/2ML IJ SOLN
50.0000 ug | Freq: Once | INTRAMUSCULAR | Status: AC
  Administered 2020-05-16: 50 ug via INTRAVENOUS
  Filled 2020-05-16: qty 2

## 2020-05-16 MED ORDER — IOHEXOL 300 MG/ML  SOLN
100.0000 mL | Freq: Once | INTRAMUSCULAR | Status: AC | PRN
  Administered 2020-05-16: 100 mL via INTRAVENOUS

## 2020-05-16 MED ORDER — ACETAMINOPHEN 500 MG PO TABS
1000.0000 mg | ORAL_TABLET | Freq: Once | ORAL | Status: AC
  Administered 2020-05-16: 1000 mg via ORAL
  Filled 2020-05-16: qty 2

## 2020-05-16 MED ORDER — LACTATED RINGERS IV BOLUS
1000.0000 mL | Freq: Once | INTRAVENOUS | Status: AC
  Administered 2020-05-16: 1000 mL via INTRAVENOUS

## 2020-05-16 MED ORDER — TETANUS-DIPHTH-ACELL PERTUSSIS 5-2.5-18.5 LF-MCG/0.5 IM SUSP
0.5000 mL | Freq: Once | INTRAMUSCULAR | Status: AC
  Administered 2020-05-16: 0.5 mL via INTRAMUSCULAR
  Filled 2020-05-16: qty 0.5

## 2020-05-16 MED ORDER — IBUPROFEN 400 MG PO TABS
600.0000 mg | ORAL_TABLET | Freq: Once | ORAL | Status: AC
  Administered 2020-05-16: 600 mg via ORAL
  Filled 2020-05-16: qty 1
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
## 2020-05-16 NOTE — ED Notes (Signed)
Pt requesting something for pain.  

## 2020-05-16 NOTE — ED Notes (Signed)
Pt remains in x-ray at this time.

## 2020-05-16 NOTE — Discharge Instructions (Signed)
Use Tylenol and ibuprofen every 6 hours as needed for pain. Keep wounds clean with soap and water.  Watch for signs of infection. Return for new or worsening symptoms. You will be sore the next 2 or 3 days from your injuries. Follow up with a dentist
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
## 2020-05-16 NOTE — ED Notes (Signed)
Pt remains alert and oriented x's 3.  Pain med given.  Family at bedside
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
## 2021-05-26 ENCOUNTER — Ambulatory Visit (HOSPITAL_COMMUNITY)
Admission: EM | Admit: 2021-05-26 | Discharge: 2021-05-26 | Disposition: A | Discharge status: Home / Self Care | Payer: No Payment, Other | Attending: Psychiatry | Admitting: Psychiatry

## 2021-05-26 DIAGNOSIS — F1990 Other psychoactive substance use, unspecified, uncomplicated: Secondary | ICD-10-CM | POA: Insufficient documentation

## 2021-05-26 NOTE — ED Provider Notes (Signed)
Behavioral Health Urgent Care Medical Screening Exam  Patient Name: Brett Gilbert MRN: [id number] Date of Evaluation: 05/26/21 Chief Complaint:   Diagnosis:  Final diagnoses:  Substance use disorder    History of Present illness: Brett Gilbert is a 35 y.o. male.  Patient presents voluntarily to [facility] behavioral health for walk-in assessment.  He reports recent stressors include chronic use of heroin, fentanyl and opioids.  He reports last use of these substances on yesterday.  He denies substance use heroin and fentanyl and opioids, he denies alcohol use.  He has attempted substance use treatment in the past however was unable to complete the program, he regrets this decision now.  Today he is seeking residential substance use treatment as he is committed to his sobriety.  Patient is assessed face-to-face by nurse practitioner.  He is seated in assessment area, no acute distress.  He is alert and oriented, pleasant and cooperative during assessment.  He presents with euthymic mood, congruent affect. He denies suicidal and homicidal ideations.  He denies any history of suicide attempts, denies history of self-harm.  He contracts verbally for safety with this Clinical research associate.  He has normal speech and behavior.  He denies both auditory and visual hallucinations.  Patient is able to converse coherently with goal-directed thoughts and no distractibility or preoccupation.  He denies paranoia.  Objectively there is no evidence of psychosis/mania or delusional thinking.  [place] denies any history of mental health treatment, denies any history of mental health diagnoses.  He is not currently linked with outpatient psychiatry and denies any current scheduled medications.  He resides in [place] with his father, denies access to weapons.  He is not currently employed.  He endorses decreased sleep and alcohol believes this is related to his substance use.  Patient offered support and  encouragement.   Psychiatric Specialty Exam  Presentation  General Appearance:Appropriate for Environment; Casual  Eye Contact:Good  Speech:Clear and Coherent; Normal Rate  Speech Volume:Normal  Handedness:Right   Mood and Affect  Mood:Euthymic  Affect:Appropriate; Congruent   Thought Process  Thought Processes:Coherent; Goal Directed; Linear  Descriptions of Associations:Intact  Orientation:Full (Time, Place and Person)  Thought Content:Logical; WDL    Hallucinations:None  Ideas of Reference:None  Suicidal Thoughts:No  Homicidal Thoughts:No   Sensorium  Memory:Immediate Good; Recent Good; Remote Good  Judgment:Good  Insight:Fair   Executive Functions  Concentration:Good  Attention Span:Good  Recall:Good  Fund of Knowledge:Good  Language:Good   Psychomotor Activity  Psychomotor Activity:Normal   Assets  Assets:Communication Skills; Desire for Improvement; Financial Resources/Insurance; Housing; Intimacy; Leisure Time; Physical Health; Resilience; Talents/Skills; Social Support   Sleep  Sleep:Poor  Number of hours:  No data recorded  Nutritional Assessment (For OBS and FBC admissions only) Has the patient had a weight loss or gain of 10 pounds or more in the last 3 months?: No Has the patient had a decrease in food intake/or appetite?: No Does the patient have dental problems?: No Does the patient have eating habits or behaviors that may be indicators of an eating disorder including binging or inducing vomiting?: No Has the patient recently lost weight without trying?: 0 Has the patient been eating poorly because of a decreased appetite?: 0 Malnutrition Screening Tool Score: 0   Physical Exam: Physical Exam Vitals and nursing note reviewed.  Constitutional:      Appearance: Normal appearance. He is well-developed.  HENT:     Head: Normocephalic and atraumatic.     Nose: Nose normal.  Cardiovascular:  Rate and Rhythm:  Normal rate.  Pulmonary:     Effort: Pulmonary effort is normal.  Musculoskeletal:     Cervical back: Normal range of motion.  Skin:    General: Skin is warm and dry.  Neurological:     Mental Status: He is alert and oriented to person, place, and time.  Psychiatric:        Attention and Perception: Attention and perception normal.        Mood and Affect: Mood and affect normal.        Speech: Speech normal.        Behavior: Behavior normal. Behavior is cooperative.        Thought Content: Thought content normal.        Cognition and Memory: Cognition and memory normal.        Judgment: Judgment normal.   Review of Systems  Constitutional: Negative.   HENT: Negative.    Eyes: Negative.   Respiratory: Negative.    Cardiovascular: Negative.   Gastrointestinal: Negative.   Genitourinary: Negative.   Musculoskeletal: Negative.   Skin: Negative.   Neurological: Negative.   Endo/Heme/Allergies: Negative.   Psychiatric/Behavioral:  Positive for substance abuse.   Blood pressure (!) 146/81, pulse 75, temperature 98.2 F (36.8 C), temperature source Oral, resp. rate 18, SpO2 100 %. There is no height or weight on file to calculate BMI.  Musculoskeletal: Strength & Muscle Tone: within normal limits Gait & Station: normal Patient leans: N/A   [facility] MSE Discharge Disposition for Follow up and Recommendations: Based on my evaluation the patient does not appear to have an emergency medical condition and can be discharged with resources and follow up care in outpatient services for Medication Management and Individual Therapy Patient reviewed with Dr. Bronwen Betters. Follow-up with outpatient substance use treatment resources provided.   Lenard Lance, FNP 05/26/2021, 2:59 PM

## 2021-05-26 NOTE — Discharge Instructions (Addendum)
Patient is instructed prior to discharge to:  Take all medications as prescribed by his/her mental healthcare provider. Report any adverse effects and or reactions from the medicines to his/her outpatient provider promptly. Keep all scheduled appointments, to ensure that you are getting refills on time and to avoid any interruption in your medication.  If you are unable to keep an appointment call to reschedule.  Be sure to follow-up with resources and follow-up appointments provided.  Patient has been instructed & cautioned: To not engage in alcohol and or illegal drug use while on prescription medicines. In the event of worsening symptoms, patient is instructed to call the crisis hotline, 911 and or go to the nearest ED for appropriate evaluation and treatment of symptoms. To follow-up with his/her primary care provider for your other medical issues, concerns and or health care needs.    Substance Abuse Treatment Resources listed Below:  [facility] - Admissions are currently completed Monday through Friday at 8am; both appointments and walk-ins are accepted.  Any individual that is a [place] resident may present for a substance abuse screening and assessment for admission.  A person may be referred by numerous sources or self-refer.   Potential clients will be screened for medical necessity and appropriateness for the program.  Clients must meet criteria for high-intensity residential treatment services.  If clinically appropriate, a client will continue with the comprehensive clinical assessment and intake process, as well as enrollment in the [facility].  Address: [address] [place], [place] [address] Admin Hours: Mon-Fri 8AM to 5PM Center Hours: 24/7 Phone: [phone] Fax: [phone]  [facility] Address: [address], [place], [place] [address] Behavioral Health Urgent Care ([facility]UC) Hours: 24/7 Phone:  [phone] Fax: [phone]  [facility]): (offers outpatient therapy and intensive outpatient substance abuse therapy).  [address], [place], [place] [address] Phone: [phone]  [facility]: Offers FREE recovery skills classes, support groups, 1:1 Peer Support, and Compeer Classes. [address], [place], [place] [address] Phone: [phone] (Call to complete intake).   [facility] [address] [place], [place] [address] Phone: [phone] ext [phone] [facility] provides food, shelter and other programs and services to the homeless men of [place] through our men's program.  By offering safe shelter, three meals a day, clean clothing, Biblical counseling, financial planning, vocational training, GED/education and employment assistance, we've helped mend the shattered lives of many homeless men since opening in 1974.  We have approximately 267 beds available, with a max of 312 beds including mats for emergency situations and currently house an average of 270 men a night.  Prospective Client Check-In Information Photo ID Required (State/ Out of State/ DOC) - if photo ID is not available, clients are required to have a printout of a police/sheriff's criminal history report. Help out with chores around the Mission. No sex offender of any type (pending, charged, registered and/or any other sex related offenses) will be permitted to check in. Must be willing to abide by all rules, regulations, and policies established by the [facility]. The following will be provided - shelter, food, clothing, and biblical counseling. If you or someone you know is in need of assistance at our men's shelter in , [place], please call [phone] ext. [phone].  [facility]-will provide timely access to mental health services for children and adolescents (4-17) and adults    presenting in a mental health crisis. The program is designed for those who need urgent Behavioral Health or Substance Use treatment and are not experiencing a medical crisis that would typically require an emergency room visit.    [address] [place], [place] [address] Phone: [phone] [email or web address]  [facility]: Phone#: [phone]  The [facility] SA Intensive Outpatient Program (SAIOP) means structured individual and group addiction activities and services that are provided at an outpatient program designed to assist adult and adolescent consumers to begin recovery and learn skills for recovery maintenance. The [facility] SAIOP program is offered at least 3 hours a day, 3 days a week.SAIOP services shall include a structured program consisting of, but not limited to, the following services: Individual counseling and support; Group counseling and support; Family counseling, training or support; Biochemical assays to identify recent drug use (e.g., urine drug screens); Strategies for relapse prevention to include community and social support systems in treatment; Life skills; Crisis contingency planning; Disease Management; and Treatment support activities that have been adapted or specifically designed for persons with physical disabilities, or persons with co-occurring disorders of mental illness and substance abuse/dependence or mental retardation/developmental disability and substance abuse/dependence. Phone: [phone]  Address:   [facility] [place] [facility] will also offer the following outpatient services: (Monday through Friday 8am-5pm)   Partial Hospitalization Program (PHP) Substance Abuse Intensive Outpatient Program (SA-IOP) Group Therapy Medication Management Peer Living Room We also provide (24/7):  Assessments: Our mental health clinician and providers will conduct a focused mental health evaluation, assessing for immediate  safety concerns and further mental health needs. Referral: Our team will provide resources and help connect to community based mental health treatment, when indicated, including psychotherapy, psychiatry, and other specialized behavioral health or substance use disorder services (for those not already in treatment). Transitional Care: Our team providers in person bridging and/or telephonic follow-up during the patient's transition to outpatient services.  [facility] 24-Hour Call Center: [phone] Behavioral Health Crisis Line: [phone]  

## 2021-05-26 NOTE — Progress Notes (Addendum)
CSW contacted detox facilities with the following results:  [facility]- no bed availability (2-3 week wait) [facility]- no bed availability [facility]- no bed availability outside of [facility] but can send information [facility]- one bed available [facility]- has bed availability and requested some information to see if he would be appropriate.  [facility]- no bed availability   CSW notified provider of bed availability. CSW also spoke with the Brett Gilbert to given an update. Brett Gilbert states that he would be ok with either of those but needed to let someone know where he was going prior to leaving here.   CSW faxed over information to [facility] (phone: [phone] fax: [phone]) and [facility] (phone: [phone] fax: [phone]). CSW will follow up regarding potential approval.   Vilma Meckel. Algis Greenhouse, MSW, LCSW, LCAS 05/26/2021 3:18 PM

## 2021-05-26 NOTE — Progress Notes (Addendum)
CSW phoned by Westley Hummer of [facility]. CSW informed that they had received paperwork and that she would give to the nurse to review. Westley Hummer stated that she would call CSW back with a determination. No other concerns expressed. Contact ended without incident.   CSW informed that pt can come but that he would still need to be assessed there. No other concerns expressed. Contact ended without incident.   CSW notified provider. CSW spoke with pt regarding [facility] and explained that he would still have to be assessed there. He agreed. No other concerns expressed. Contact ended without incident.    Vilma Meckel. Algis Greenhouse, MSW, LCSW, LCAS 05/26/2021 3:42 PM

## 2021-05-26 NOTE — ED Notes (Signed)
Pt discharged in no acute distress. Verbalized understanding of instructions printed on AVS. Safety maintained.

## 2021-05-26 NOTE — BH Assessment (Signed)
Pt wants to detox from heroin/fentanyl. Denies SI, HI, AVH. Detox last month in a facility in [place].  Pt reports using gram of heroin daily.  Pt is routine

## 2021-05-27 ENCOUNTER — Telehealth (HOSPITAL_COMMUNITY): Payer: Self-pay

## 2021-05-27 NOTE — BH Assessment (Signed)
Care Management - Follow Up [facility] Discharges   Writer attempted to make contact with patient today and was unsuccessful.  Writer left a HIPPA compliant voice message.   Per chart review, patient was referred to [facility] in [place].

## 2022-06-20 DIAGNOSIS — F1190 Opioid use, unspecified, uncomplicated: Secondary | ICD-10-CM | POA: Diagnosis not present

## 2022-06-23 ENCOUNTER — Other Ambulatory Visit (HOSPITAL_COMMUNITY)
Admission: EM | Admit: 2022-06-23 | Discharge: 2022-06-23 | Disposition: A | Discharge status: Home / Self Care | Payer: No Payment, Other | Attending: Psychiatry | Admitting: Psychiatry

## 2022-06-23 ENCOUNTER — Other Ambulatory Visit: Payer: Self-pay

## 2022-06-23 DIAGNOSIS — F1190 Opioid use, unspecified, uncomplicated: Secondary | ICD-10-CM | POA: Insufficient documentation

## 2022-06-23 DIAGNOSIS — F1994 Other psychoactive substance use, unspecified with psychoactive substance-induced mood disorder: Secondary | ICD-10-CM | POA: Diagnosis not present

## 2022-06-23 DIAGNOSIS — J45909 Unspecified asthma, uncomplicated: Secondary | ICD-10-CM | POA: Diagnosis not present

## 2022-06-23 DIAGNOSIS — Z1152 Encounter for screening for COVID-19: Secondary | ICD-10-CM | POA: Diagnosis not present

## 2022-06-23 DIAGNOSIS — Z818 Family history of other mental and behavioral disorders: Secondary | ICD-10-CM | POA: Insufficient documentation

## 2022-06-23 DIAGNOSIS — R400 Somnolence: Secondary | ICD-10-CM | POA: Insufficient documentation

## 2022-06-23 DIAGNOSIS — S0993XA Unspecified injury of face, initial encounter: Secondary | ICD-10-CM

## 2022-06-23 LAB — LIPID PANEL
Cholesterol: 193 mg/dL (ref 0–200)
HDL: 75 mg/dL (ref >40)
LDL Cholesterol: 110 mg/dL — ABNORMAL HIGH (ref 0–99)
Total CHOL/HDL Ratio: 2.6 RATIO
Triglycerides: 38 mg/dL (ref <150)
VLDL: 8 mg/dL (ref 0–40)

## 2022-06-23 LAB — POCT URINE DRUG SCREEN - MANUAL ENTRY (I-SCREEN)
POC Amphetamine UR: NOT DETECTED
POC Buprenorphine (BUP): NOT DETECTED
POC Cocaine UR: POSITIVE — AB
POC Marijuana UR: NOT DETECTED
POC Methadone UR: NOT DETECTED
POC Methamphetamine UR: NOT DETECTED
POC Morphine: POSITIVE — AB
POC Oxazepam (BZO): POSITIVE — AB
POC Oxycodone UR: NOT DETECTED
POC Secobarbital (BAR): NOT DETECTED

## 2022-06-23 LAB — CBC WITH DIFFERENTIAL/PLATELET
Abs Immature Granulocytes: 0.04 10*3/uL (ref 0.00–0.07)
Basophils Absolute: 0.1 10*3/uL (ref 0.0–0.1)
Basophils Relative: 0 %
Eosinophils Absolute: 0.4 10*3/uL (ref 0.0–0.5)
Eosinophils Relative: 3 %
HCT: 37.7 % — ABNORMAL LOW (ref 39.0–52.0)
Hemoglobin: 11.9 g/dL — ABNORMAL LOW (ref 13.0–17.0)
Immature Granulocytes: 0 %
Lymphocytes Relative: 17 %
Lymphs Abs: 2.1 10*3/uL (ref 0.7–4.0)
MCH: 28.1 pg (ref 26.0–34.0)
MCHC: 31.6 g/dL (ref 30.0–36.0)
MCV: 88.9 fL (ref 80.0–100.0)
Monocytes Absolute: 0.7 10*3/uL (ref 0.1–1.0)
Monocytes Relative: 6 %
Neutro Abs: 8.9 10*3/uL — ABNORMAL HIGH (ref 1.7–7.7)
Neutrophils Relative %: 74 %
Platelets: 263 10*3/uL (ref 150–400)
RBC: 4.24 MIL/uL (ref 4.22–5.81)
RDW: 13.7 % (ref 11.5–15.5)
WBC: 12.2 10*3/uL — ABNORMAL HIGH (ref 4.0–10.5)
nRBC: 0 % (ref 0.0–0.2)

## 2022-06-23 LAB — COMPREHENSIVE METABOLIC PANEL
ALT: 49 U/L — ABNORMAL HIGH (ref 0–44)
AST: 62 U/L — ABNORMAL HIGH (ref 15–41)
Albumin: 3 g/dL — ABNORMAL LOW (ref 3.5–5.0)
Alkaline Phosphatase: 50 U/L (ref 38–126)
Anion gap: 7 (ref 5–15)
BUN: 18 mg/dL (ref 6–20)
CO2: 30 mmol/L (ref 22–32)
Calcium: 9 mg/dL (ref 8.9–10.3)
Chloride: 105 mmol/L (ref 98–111)
Creatinine, Ser: 1.02 mg/dL (ref 0.61–1.24)
GFR, Estimated: >60 mL/min (ref >60)
Glucose, Bld: 99 mg/dL (ref 70–99)
Potassium: 4.5 mmol/L (ref 3.5–5.1)
Sodium: 142 mmol/L (ref 135–145)
Total Bilirubin: 0.3 mg/dL (ref 0.3–1.2)
Total Protein: 6 g/dL — ABNORMAL LOW (ref 6.5–8.1)

## 2022-06-23 LAB — POC SARS CORONAVIRUS 2 AG: SARSCOV2ONAVIRUS 2 AG: NEGATIVE

## 2022-06-23 LAB — TSH: TSH: 0.944 u[IU]/mL (ref 0.350–4.500)

## 2022-06-23 LAB — RESP PANEL BY RT-PCR (FLU A&B, COVID) ARPGX2
Influenza A by PCR: NEGATIVE
Influenza B by PCR: NEGATIVE
SARS Coronavirus 2 by RT PCR: NEGATIVE

## 2022-06-23 LAB — HIV ANTIBODY (ROUTINE TESTING W REFLEX): HIV Screen 4th Generation wRfx: NONREACTIVE

## 2022-06-23 LAB — HEMOGLOBIN A1C
Hgb A1c MFr Bld: 5.9 % — ABNORMAL HIGH (ref 4.8–5.6)
Mean Plasma Glucose: 122.63 mg/dL

## 2022-06-23 LAB — ETHANOL: Alcohol, Ethyl (B): <10 mg/dL (ref <10)

## 2022-06-23 MED ORDER — CLONIDINE HCL 0.1 MG PO TABS
0.1000 mg | ORAL_TABLET | Freq: Four times a day (QID) | ORAL | Status: DC
  Administered 2022-06-23: 0.1 mg via ORAL
  Filled 2022-06-23: qty 1

## 2022-06-23 MED ORDER — CLONIDINE HCL 0.1 MG PO TABS: 0.1000 mg | ORAL_TABLET | Freq: Every day | ORAL | Status: DC

## 2022-06-23 MED ORDER — ALUM & MAG HYDROXIDE-SIMETH 200-200-20 MG/5ML PO SUSP: 30.0000 mL | ORAL | Status: DC | PRN

## 2022-06-23 MED ORDER — DICYCLOMINE HCL 20 MG PO TABS: 20.0000 mg | ORAL_TABLET | Freq: Four times a day (QID) | ORAL | Status: DC | PRN

## 2022-06-23 MED ORDER — ACETAMINOPHEN 325 MG PO TABS
650.0000 mg | ORAL_TABLET | Freq: Four times a day (QID) | ORAL | Status: DC | PRN
  Administered 2022-06-23: 650 mg via ORAL
  Filled 2022-06-23: qty 2

## 2022-06-23 MED ORDER — ALBUTEROL SULFATE HFA 108 (90 BASE) MCG/ACT IN AERS: 1.0000 | INHALATION_SPRAY | RESPIRATORY_TRACT | Status: DC | PRN

## 2022-06-23 MED ORDER — CLONIDINE HCL 0.1 MG PO TABS: 0.1000 mg | ORAL_TABLET | ORAL | Status: DC

## 2022-06-23 MED ORDER — MAGNESIUM HYDROXIDE 400 MG/5ML PO SUSP: 30.0000 mL | Freq: Every day | ORAL | Status: DC | PRN

## 2022-06-23 MED ORDER — HYDROXYZINE HCL 25 MG PO TABS: 25.0000 mg | ORAL_TABLET | Freq: Three times a day (TID) | ORAL | Status: DC | PRN

## 2022-06-23 MED ORDER — TRAZODONE HCL 50 MG PO TABS: 50.0000 mg | ORAL_TABLET | Freq: Every evening | ORAL | Status: DC | PRN

## 2022-06-23 MED ORDER — METHOCARBAMOL 500 MG PO TABS: 500.0000 mg | ORAL_TABLET | Freq: Three times a day (TID) | ORAL | Status: DC | PRN

## 2022-06-23 MED ORDER — LOPERAMIDE HCL 2 MG PO CAPS: 2.0000 mg | ORAL_CAPSULE | ORAL | Status: DC | PRN

## 2022-06-23 MED ORDER — BENZOCAINE 10 % MT GEL
1.0000 | Freq: Three times a day (TID) | OROMUCOSAL | Status: DC | PRN
  Administered 2022-06-23: 1 via OROMUCOSAL

## 2022-06-23 MED ORDER — ONDANSETRON 4 MG PO TBDP: 4.0000 mg | ORAL_TABLET | Freq: Four times a day (QID) | ORAL | Status: DC | PRN

## 2022-06-23 MED ORDER — NAPROXEN 500 MG PO TABS: 500.0000 mg | ORAL_TABLET | Freq: Two times a day (BID) | ORAL | Status: DC | PRN
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
## 2022-06-23 NOTE — BH Assessment (Signed)
Pt wants to detox from heroin/fentanyl. Denies SI, HI, AVH.  Pt reports using gram of heroin daily.
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
## 2022-06-23 NOTE — Discharge Instructions (Signed)
Discharge recommendations:  ?Please see information for follow-up appointment with psychiatry and therapy. ?Please follow up with your primary care provider for all medical related needs.  ? ?Therapy: We recommend that patient participate in individual therapy to address mental health concerns. ? ?Medications: The parent/guardian is to contact a medical professional and/or outpatient provider to address any new side effects that develop. Parent/guardian should update outpatient providers of any new medications and/or medication changes.  ? ?Safety:  ?The patient should abstain from use of illicit substances/drugs and abuse of any medications. ?If symptoms worsen or do not continue to improve or if the patient becomes actively suicidal or homicidal then it is recommended that the patient return to the closest hospital emergency department, the [place] [facility], or call 911 for further evaluation and treatment. ?National Suicide Prevention Lifeline 1-800-SUICIDE or 1-800-273-8255. ? ?About 988 ?988 offers 24/7 access to trained crisis counselors who can help people experiencing mental health-related distress. People can call or text 988 or chat 988lifeline.org for themselves or if they are worried about a loved one who may need crisis support.  ? ? ?  ?
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
## 2022-06-23 NOTE — Progress Notes (Signed)
Received Brett Gilbert from [facility] with his nurse, he was cooperative with the admission process,but mid- way he called his mom and she is coming to take him to the dentist. He feels he cannot stay here with a broken tooth. He is tearful at intervals.

## 2022-06-23 NOTE — Progress Notes (Signed)
Brett Gilbert received his discharge AVS, questions answered and he was escorted to retrieve his personal belongings.

## 2022-06-24 LAB — RPR: RPR Ser Ql: NONREACTIVE

## 2022-06-26 LAB — GC/CHLAMYDIA PROBE AMP (~~LOC~~) NOT AT ARMC
Chlamydia: NEGATIVE
Comment: NEGATIVE
Comment: NORMAL
Neisseria Gonorrhea: NEGATIVE

## 2022-12-21 ENCOUNTER — Emergency Department (HOSPITAL_COMMUNITY): Payer: Commercial Managed Care - HMO

## 2022-12-21 ENCOUNTER — Encounter (HOSPITAL_COMMUNITY): Payer: Self-pay

## 2022-12-21 ENCOUNTER — Emergency Department (HOSPITAL_COMMUNITY)
Admission: EM | Admit: 2022-12-21 | Discharge: 2022-12-21 | Disposition: A | Discharge status: Home / Self Care | Payer: Commercial Managed Care - HMO | Attending: Emergency Medicine | Admitting: Emergency Medicine

## 2022-12-21 DIAGNOSIS — J45901 Unspecified asthma with (acute) exacerbation: Secondary | ICD-10-CM | POA: Diagnosis not present

## 2022-12-21 DIAGNOSIS — R0602 Shortness of breath: Secondary | ICD-10-CM | POA: Diagnosis present

## 2022-12-21 MED ORDER — PREDNISONE 20 MG PO TABS
60.0000 mg | ORAL_TABLET | Freq: Once | ORAL | Status: AC
  Administered 2022-12-21: 60 mg via ORAL
  Filled 2022-12-21: qty 3

## 2022-12-21 MED ORDER — ALBUTEROL SULFATE HFA 108 (90 BASE) MCG/ACT IN AERS
1.0000 | INHALATION_SPRAY | RESPIRATORY_TRACT | Status: DC | PRN
  Administered 2022-12-21: 2 via RESPIRATORY_TRACT
  Filled 2022-12-21: qty 6.7

## 2022-12-21 MED ORDER — PREDNISONE 10 MG PO TABS: 40.0000 mg | ORAL_TABLET | Freq: Every day | ORAL | 0 refills | Status: AC

## 2022-12-21 NOTE — ED Triage Notes (Signed)
Pt arrives c/o shortness of breath and an asthma exacerbation. Pt states he has not had an asthma attack in years and misplaced his inhaler. Pt is able to answer in full sentences but has increased WOB

## 2022-12-21 NOTE — ED Provider Triage Note (Signed)
Emergency Medicine Provider Triage Evaluation Note  Brett Gilbert , a 37 y.o. male  was evaluated in triage.  Pt complains of shortness of breath.  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  Reports some feelings of shortness of breath yesterday with acute worsening upon awakening today.  States he has a history of asthma but has not had to use inhaler for some time.  States that his current presentation "feels a lot like asthma attack."  Denies fever, abdominal pain, nausea, vomiting.  Review of Systems  Positive: See above Negative:   Physical Exam  BP (!) 158/107 (BP Location: Right Arm)   Pulse 96   Temp 98 F (36.7 C) (Oral)   Resp (!) 30   Ht 6\' 2"  (1.88 m)   Wt 72.6 kg   SpO2 93%   BMI 20.54 kg/m  Gen:   Awake, no distress   Resp:  Normal effort  MSK:   Moves extremities without difficulty  Other:  Diffuse wheeze/rhonchorous breath sounds bilaterally.  Medical Decision Making  Medically screening exam initiated at 4:16 PM.  Appropriate orders placed.  Brett Gilbert was informed that the remainder of the evaluation will be completed by another provider, this initial triage assessment does not replace that evaluation, and the importance of remaining in the ED until their evaluation is complete.     Peter Garter, [place] 12/21/22 [phone]

## 2022-12-21 NOTE — ED Provider Notes (Signed)
EMERGENCY DEPARTMENT AT [facility] Provider Note   CSN: [id number] Arrival date & time: 12/21/22  1555     History  Chief Complaint  Patient presents with   Shortness of Breath   Asthma    Brett Gilbert is a 37 y.o. male.  HPI   PResents with shortness of breath, feels like asthma If got up and started moving would feel like coughing  Smokes every now and again No chest pain No leg swelling or pain No fever No congestion or sore throat No hx of dvt/surgery/pe/car trips/airplane rides Does have seasonal allergies from pollen  Couldn't find inhaler After receiving albuterol inhaler feels back to baseline    Past Medical History:  Diagnosis Date   Asthma        Home Medications Prior to Admission medications   Medication Sig Start Date End Date Taking? Authorizing Provider  predniSONE (DELTASONE) 10 MG tablet Take 4 tablets (40 mg total) by mouth daily for 4 days. 12/21/22 12/25/22 Yes Alvira Monday, MD      Allergies    Seasonal ic [octacosanol]    Review of Systems   Review of Systems  Physical Exam Updated Vital Signs BP (!) 143/103   Pulse 75   Temp 98.1 F (36.7 C) (Oral)   Resp 12   Ht 6\' 2"  (1.88 m)   Wt 72.6 kg   SpO2 93%   BMI 20.54 kg/m  Physical Exam Vitals and nursing note reviewed.  Constitutional:      General: He is not in acute distress.    Appearance: He is well-developed. He is not diaphoretic.  HENT:     Head: Normocephalic and atraumatic.  Eyes:     Conjunctiva/sclera: Conjunctivae normal.  Cardiovascular:     Rate and Rhythm: Normal rate and regular rhythm.     Heart sounds: Normal heart sounds. No murmur heard.    No friction rub. No gallop.  Pulmonary:     Effort: Pulmonary effort is normal. No respiratory distress.     Breath sounds: Wheezing (end expiratory) present. No rales.  Abdominal:     General: There is no distension.     Palpations: Abdomen is soft.     Tenderness: There is no  abdominal tenderness. There is no guarding.  Musculoskeletal:     Cervical back: Normal range of motion.  Skin:    General: Skin is warm and dry.  Neurological:     Mental Status: He is alert and oriented to person, place, and time.     ED Results / Procedures / Treatments   Labs (all labs ordered are listed, but only abnormal results are displayed) Labs Reviewed - No data to display  EKG EKG Interpretation  Date/Time:  Thursday Dec 21 2022 16:02:27 EDT Ventricular Rate:  102 PR Interval:  138 QRS Duration: 86 QT Interval:  332 QTC Calculation: 432 R Axis:   94 Text Interpretation: Sinus tachycardia Biatrial enlargement Rightward axis Pulmonary disease pattern ST & T wave abnormality, consider inferior ischemia Abnormal ECG When compared with ECG of 23-Jun-2022 14:08, Since prior ECG< heart rate increased, nonspcific changes present Confirmed by Alvira Monday ([id number]) on 12/21/2022 6:23:24 PM  Radiology DG Chest 2 View  Result Date: 12/21/2022 CLINICAL DATA:  Shortness of breath since yesterday. EXAM: CHEST - 2 VIEW COMPARISON:  Radiographs 05/16/2020 and 11/14/2018.  CT 05/16/2020. FINDINGS: The heart size and mediastinal contours are stable. The lungs are clear. There is no pleural effusion or pneumothorax.  No acute osseous findings are identified. Mild upper thoracic scoliosis. IMPRESSION: No active cardiopulmonary process. Mild upper thoracic scoliosis. Electronically Signed   By: Carey Bullocks M.D.   On: 12/21/2022 16:56    Procedures Procedures    Medications Ordered in ED Medications  predniSONE (DELTASONE) tablet 60 mg (60 mg Oral Given 12/21/22 1611)    ED Course/ Medical Decision Making/ A&P      37yo male with history of asthma who presents with concern for shortness of breath.  Feels like prior asthma, has pollen allergies.  CXR completed and evaluated by me and radiology and shows no pneumonia, pulmonary edema or pneumothorax.   Doubt PE, low risk  Wells and PERC negative. No signs of CHF, clinically low suspicion for ACS.   Given decadron and albuterol at triage. At time of my evaluation has end expiratory wheezing but otherwise feeling improved. Stable for continued outpatient treatment of asthma exacerbation. Given prednisone rx, albuterol inhaler to take home.         Final Clinical Impression(s) / ED Diagnoses Final diagnoses:  Exacerbation of asthma, unspecified asthma severity, unspecified whether persistent    Rx / DC Orders ED Discharge Orders          Ordered    predniSONE (DELTASONE) 10 MG tablet  Daily        12/21/22 2011              Alvira Monday, MD 12/22/22 1234

## 2023-08-06 ENCOUNTER — Other Ambulatory Visit (INDEPENDENT_AMBULATORY_CARE_PROVIDER_SITE_OTHER)
Admission: EM | Admit: 2023-08-06 | Discharge: 2023-08-07 | Discharge status: Against Medical Advice | Payer: Commercial Managed Care - HMO | Source: Home / Self Care | Admitting: Psychiatry

## 2023-08-06 ENCOUNTER — Ambulatory Visit (HOSPITAL_COMMUNITY)
Admission: EM | Admit: 2023-08-06 | Discharge: 2023-08-06 | Disposition: A | Discharge status: Other Acute Inpatient Hospital | Payer: Commercial Managed Care - HMO | Attending: Psychiatry | Admitting: Psychiatry

## 2023-08-06 DIAGNOSIS — F1190 Opioid use, unspecified, uncomplicated: Secondary | ICD-10-CM | POA: Diagnosis not present

## 2023-08-06 DIAGNOSIS — F1110 Opioid abuse, uncomplicated: Secondary | ICD-10-CM | POA: Insufficient documentation

## 2023-08-06 DIAGNOSIS — F419 Anxiety disorder, unspecified: Secondary | ICD-10-CM | POA: Insufficient documentation

## 2023-08-06 DIAGNOSIS — Z79899 Other long term (current) drug therapy: Secondary | ICD-10-CM | POA: Insufficient documentation

## 2023-08-06 DIAGNOSIS — F1410 Cocaine abuse, uncomplicated: Secondary | ICD-10-CM | POA: Insufficient documentation

## 2023-08-06 DIAGNOSIS — Z818 Family history of other mental and behavioral disorders: Secondary | ICD-10-CM | POA: Insufficient documentation

## 2023-08-06 DIAGNOSIS — F17210 Nicotine dependence, cigarettes, uncomplicated: Secondary | ICD-10-CM | POA: Insufficient documentation

## 2023-08-06 DIAGNOSIS — F1120 Opioid dependence, uncomplicated: Secondary | ICD-10-CM | POA: Insufficient documentation

## 2023-08-06 DIAGNOSIS — F17200 Nicotine dependence, unspecified, uncomplicated: Secondary | ICD-10-CM

## 2023-08-06 LAB — COMPREHENSIVE METABOLIC PANEL
ALT: 40 U/L (ref 0–44)
AST: 34 U/L (ref 15–41)
Albumin: 4.2 g/dL (ref 3.5–5.0)
Alkaline Phosphatase: 81 U/L (ref 38–126)
Anion gap: 10 (ref 5–15)
BUN: 15 mg/dL (ref 6–20)
CO2: 29 mmol/L (ref 22–32)
Calcium: 9.9 mg/dL (ref 8.9–10.3)
Chloride: 100 mmol/L (ref 98–111)
Creatinine, Ser: 0.97 mg/dL (ref 0.61–1.24)
GFR, Estimated: >60 mL/min (ref >60)
Glucose, Bld: 82 mg/dL (ref 70–99)
Potassium: 4.7 mmol/L (ref 3.5–5.1)
Sodium: 139 mmol/L (ref 135–145)
Total Bilirubin: 1 mg/dL (ref 0.0–1.2)
Total Protein: 7.5 g/dL (ref 6.5–8.1)

## 2023-08-06 LAB — HEMOGLOBIN A1C
Hgb A1c MFr Bld: 6.1 % — ABNORMAL HIGH (ref 4.8–5.6)
Mean Plasma Glucose: 128 mg/dL

## 2023-08-06 LAB — CBC WITH DIFFERENTIAL/PLATELET
Abs Immature Granulocytes: 0.02 10*3/uL (ref 0.00–0.07)
Basophils Absolute: 0 10*3/uL (ref 0.0–0.1)
Basophils Relative: 1 %
Eosinophils Absolute: 0.1 10*3/uL (ref 0.0–0.5)
Eosinophils Relative: 1 %
HCT: 41.6 % (ref 39.0–52.0)
Hemoglobin: 13.3 g/dL (ref 13.0–17.0)
Immature Granulocytes: 0 %
Lymphocytes Relative: 18 %
Lymphs Abs: 1.5 10*3/uL (ref 0.7–4.0)
MCH: 27.8 pg (ref 26.0–34.0)
MCHC: 32 g/dL (ref 30.0–36.0)
MCV: 86.8 fL (ref 80.0–100.0)
Monocytes Absolute: 0.4 10*3/uL (ref 0.1–1.0)
Monocytes Relative: 4 %
Neutro Abs: 6.2 10*3/uL (ref 1.7–7.7)
Neutrophils Relative %: 76 %
Platelets: 320 10*3/uL (ref 150–400)
RBC: 4.79 MIL/uL (ref 4.22–5.81)
RDW: 13.5 % (ref 11.5–15.5)
WBC: 8.2 10*3/uL (ref 4.0–10.5)
nRBC: 0 % (ref 0.0–0.2)

## 2023-08-06 LAB — LIPID PANEL
Cholesterol: 171 mg/dL (ref 0–200)
HDL: 64 mg/dL (ref >40)
LDL Cholesterol: 97 mg/dL (ref 0–99)
Total CHOL/HDL Ratio: 2.7 {ratio}
Triglycerides: 51 mg/dL (ref <150)
VLDL: 10 mg/dL (ref 0–40)

## 2023-08-06 LAB — TSH: TSH: 0.298 u[IU]/mL — ABNORMAL LOW (ref 0.350–4.500)

## 2023-08-06 LAB — T4, FREE: Free T4: 1.1 ng/dL (ref 0.61–1.12)

## 2023-08-06 LAB — POCT URINE DRUG SCREEN - MANUAL ENTRY (I-SCREEN)
POC Amphetamine UR: NOT DETECTED
POC Buprenorphine (BUP): POSITIVE — AB
POC Cocaine UR: POSITIVE — AB
POC Marijuana UR: POSITIVE — AB
POC Methadone UR: NOT DETECTED
POC Methamphetamine UR: NOT DETECTED
POC Morphine: NOT DETECTED
POC Oxazepam (BZO): NOT DETECTED
POC Oxycodone UR: NOT DETECTED
POC Secobarbital (BAR): NOT DETECTED

## 2023-08-06 LAB — MAGNESIUM: Magnesium: 2.3 mg/dL (ref 1.7–2.4)

## 2023-08-06 LAB — ETHANOL: Alcohol, Ethyl (B): <10 mg/dL (ref <10)

## 2023-08-06 MED ORDER — CLONIDINE HCL 0.1 MG PO TABS: 0.1000 mg | ORAL_TABLET | Freq: Every day | ORAL | Status: DC

## 2023-08-06 MED ORDER — CLONIDINE HCL 0.1 MG PO TABS: 0.1000 mg | ORAL_TABLET | Freq: Four times a day (QID) | ORAL | Status: DC

## 2023-08-06 MED ORDER — NAPROXEN 500 MG PO TABS
500.0000 mg | ORAL_TABLET | Freq: Two times a day (BID) | ORAL | Status: DC | PRN
  Administered 2023-08-06: 500 mg via ORAL
  Filled 2023-08-06: qty 1

## 2023-08-06 MED ORDER — HYDROXYZINE HCL 25 MG PO TABS: 25.0000 mg | ORAL_TABLET | Freq: Four times a day (QID) | ORAL | Status: DC | PRN

## 2023-08-06 MED ORDER — NAPROXEN 500 MG PO TABS: 500.0000 mg | ORAL_TABLET | Freq: Two times a day (BID) | ORAL | Status: DC | PRN

## 2023-08-06 MED ORDER — ACETAMINOPHEN 325 MG PO TABS
650.0000 mg | ORAL_TABLET | Freq: Four times a day (QID) | ORAL | Status: DC | PRN
  Administered 2023-08-07: 650 mg via ORAL
  Filled 2023-08-06: qty 2

## 2023-08-06 MED ORDER — ALUM & MAG HYDROXIDE-SIMETH 200-200-20 MG/5ML PO SUSP: 30.0000 mL | ORAL | Status: DC | PRN

## 2023-08-06 MED ORDER — MAGNESIUM HYDROXIDE 400 MG/5ML PO SUSP: 30.0000 mL | Freq: Every day | ORAL | Status: DC | PRN

## 2023-08-06 MED ORDER — ACETAMINOPHEN 325 MG PO TABS: 650.0000 mg | ORAL_TABLET | Freq: Four times a day (QID) | ORAL | Status: DC | PRN

## 2023-08-06 MED ORDER — HYDROXYZINE HCL 25 MG PO TABS
25.0000 mg | ORAL_TABLET | Freq: Four times a day (QID) | ORAL | Status: DC | PRN
  Administered 2023-08-06: 25 mg via ORAL
  Filled 2023-08-06: qty 1

## 2023-08-06 MED ORDER — CLONIDINE HCL 0.1 MG PO TABS
0.1000 mg | ORAL_TABLET | ORAL | Status: DC
Start: 2023-08-08 — End: 2023-08-10

## 2023-08-06 MED ORDER — ONDANSETRON 4 MG PO TBDP
4.0000 mg | ORAL_TABLET | Freq: Four times a day (QID) | ORAL | Status: DC | PRN
  Administered 2023-08-06: 4 mg via ORAL
  Filled 2023-08-06: qty 1

## 2023-08-06 MED ORDER — LOPERAMIDE HCL 2 MG PO CAPS
2.0000 mg | ORAL_CAPSULE | ORAL | Status: DC | PRN
Start: 2023-08-06 — End: 2023-08-11

## 2023-08-06 MED ORDER — TRAZODONE HCL 50 MG PO TABS: 50.0000 mg | ORAL_TABLET | Freq: Every evening | ORAL | Status: DC | PRN

## 2023-08-06 MED ORDER — DICYCLOMINE HCL 20 MG PO TABS
20.0000 mg | ORAL_TABLET | Freq: Four times a day (QID) | ORAL | Status: DC | PRN
  Administered 2023-08-06: 20 mg via ORAL
  Filled 2023-08-06: qty 1

## 2023-08-06 MED ORDER — METHOCARBAMOL 500 MG PO TABS: 500.0000 mg | ORAL_TABLET | Freq: Three times a day (TID) | ORAL | Status: DC | PRN

## 2023-08-06 MED ORDER — METHOCARBAMOL 500 MG PO TABS
500.0000 mg | ORAL_TABLET | Freq: Three times a day (TID) | ORAL | Status: DC | PRN
  Administered 2023-08-06: 500 mg via ORAL
  Filled 2023-08-06: qty 1

## 2023-08-06 MED ORDER — LOPERAMIDE HCL 2 MG PO CAPS: 2.0000 mg | ORAL_CAPSULE | ORAL | Status: DC | PRN

## 2023-08-06 MED ORDER — CLONIDINE HCL 0.1 MG PO TABS: 0.1000 mg | ORAL_TABLET | ORAL | Status: DC

## 2023-08-06 MED ORDER — NICOTINE 14 MG/24HR TD PT24
14.0000 mg | MEDICATED_PATCH | Freq: Every day | TRANSDERMAL | Status: DC
  Administered 2023-08-06: 14 mg via TRANSDERMAL
  Filled 2023-08-06 (×2): qty 1

## 2023-08-06 MED ORDER — DICYCLOMINE HCL 20 MG PO TABS: 20.0000 mg | ORAL_TABLET | Freq: Four times a day (QID) | ORAL | Status: DC | PRN

## 2023-08-06 MED ORDER — CLONIDINE HCL 0.1 MG PO TABS
0.1000 mg | ORAL_TABLET | Freq: Four times a day (QID) | ORAL | Status: DC
  Administered 2023-08-06 – 2023-08-07 (×4): 0.1 mg via ORAL
  Filled 2023-08-06 (×4): qty 1

## 2023-08-06 MED ORDER — TRAZODONE HCL 50 MG PO TABS
50.0000 mg | ORAL_TABLET | Freq: Every evening | ORAL | Status: DC | PRN
  Administered 2023-08-06: 50 mg via ORAL
  Filled 2023-08-06: qty 1

## 2023-08-06 MED ORDER — ONDANSETRON 4 MG PO TBDP: 4.0000 mg | ORAL_TABLET | Freq: Four times a day (QID) | ORAL | Status: DC | PRN

## 2023-08-06 NOTE — ED Notes (Signed)
Patient is in the bedroom calm and sleeping. NAD. Respirations are even and unlabored. Will monitor for safety.

## 2023-08-06 NOTE — ED Notes (Signed)
Patient admitted to [facility] for substance abuse treatment.  He is calm, cooperative with care.  Skin assessment completed, belongings secured.  Unit rules and policies explained and provided.  Patient voiced no comments, no concerns.  Patient verbalized understanding of information provided to him verbally and in written form.

## 2023-08-06 NOTE — Tx Team (Signed)
LCSW met with patient at bedside to assess current mood, affect, physical state, and inquire about needs/goals while here in [facility] and after discharge. Limited information was provided by patient. Patient reports he presented due to "my addiction to fentanyl and heroin". Patient reports he has been using theses substances for years and denies ever seeking treatment for said substance. Patient reports he lives at home with his father and reports no issues in the home. Patient reports he does not have any support and reports his goal is to seek further treatment for himself once his detox is complete. Patient denies having any legal charges or upcoming court dates. Patient denies having access to transportation or reports he is not working at this time. Patient reported that he is really tired and would like to rest. Patient provided permission for LCSW to send his clinicals out for review. Updates will be provided as received.   Per chart review (H&P): "Brett Gilbert is a 37 y.o. male with a past psychiatric history of opioid use disorder (fentanyl and heroin) and stimulant use disorder cocaine type who presents to the [facility] for detox and substance use services.  Patient reports snorting fentanyl and heroin for the past 6 years.  He consumes about 0.5 g daily and his last use was 12/29.  Patient also reports consuming alcohol and snorting cocaine twice weekly.  He is unable to state the amount of alcohol.  Reports snorting about $60 worth of cocaine at a time.  He also reports occasional cigarette use.  He previously came to [facility] November of last year in which she was sent to [facility].  He reports staying there about 11 days for detox and was discharged.  He denied having outpatient psychiatric resources afterwards.  Although he went to [facility] last year, he denies participating in a residential treatment program.  He reports withdrawal symptoms of anxiety at this time. Discussed patient's UDS results and he admits  to obtaining Suboxone off the streets along with heroin. He denies taking medications prior to admission and denies medical history. Patient currently lives with his father who has schizophrenia and is disabled.  He works part-time in a [employer], engineering.  He denies any legal obligations.  He requests to be transferred to a residential treatment program after detoxing from substances".   Brett Boyden, LCSW Clinical Social Worker [facility] Ph: [phone]

## 2023-08-06 NOTE — ED Notes (Signed)
Patient in the bedroom sleeping comfortable. Denies needing anything atm. Will continue to monitor for safety.

## 2023-08-06 NOTE — ED Provider Notes (Addendum)
Facility Based Crisis Admission H&P  Date: 08/06/23 Patient Name: Brett Gilbert MRN: [id number]  Diagnoses:  Final diagnoses:  Cocaine use disorder [facility])  Heroin use disorder, severe (HCC)  Tobacco use disorder    HPI:  Brett Gilbert is a 37 y.o. male with a past psychiatric history of opioid use disorder (fentanyl and heroin) and stimulant use disorder cocaine type who presents to the [facility] for detox and substance use services.  Patient reports snorting fentanyl and heroin for the past 6 years.  He consumes about 0.5 g daily and his last use was 12/29.  Patient also reports consuming alcohol and snorting cocaine twice weekly.  He is unable to state the amount of alcohol.  Reports snorting about $60 worth of cocaine at a time.  He also reports occasional cigarette use.  He previously came to [facility] November of last year in which she was sent to [facility].  He reports staying there about 11 days for detox and was discharged.  He denied having outpatient psychiatric resources afterwards.  Although he went to [facility] last year, he denies participating in a residential treatment program.  He reports withdrawal symptoms of anxiety at this time.  I discussed patient's UDS results and he admits to obtaining Suboxone off the streets along with heroin.  He denies taking medications prior to admission and denies medical history.  Patient currently lives with his father who has schizophrenia and is disabled.  He works part-time in a [employer]ologist, engineering.  He denies any legal obligations.  He requests to be transferred to a residential treatment program after detoxing from substances.  PHQ 2-9:  Flowsheet Row ED from 08/06/2023 in [facility]  Thoughts that you would be better off dead, or of hurting yourself in some way Not at all  PHQ-9 Total Score 17       Flowsheet Row ED from 08/06/2023 in [facility] Most recent reading at 08/06/2023  1:50 PM  ED from 08/06/2023 in [facility] Most recent reading at 08/06/2023  9:21 AM ED from 12/21/2022 in [facility] Most recent reading at 12/21/2022  4:02 PM  C-SSRS RISK CATEGORY No Risk No Risk No Risk         Total Time spent with patient: 20 minutes  Musculoskeletal  Strength & Muscle Tone: within normal limits Gait & Station: normal Patient leans: N/A  Psychiatric Specialty Exam  Presentation General Appearance:  Disheveled  Eye Contact: Fair  Speech: Clear and Coherent; Normal Rate  Speech Volume: Decreased  Handedness: Right   Mood and Affect  Mood: Anxious  Affect: Congruent   Thought Process  Thought Processes: Coherent  Descriptions of Associations:Intact  Orientation:Full (Time, Place and Person)  Thought Content:Logical  Diagnosis of Schizophrenia or Schizoaffective disorder in past: No   Hallucinations:Hallucinations: None  Ideas of Reference:None  Suicidal Thoughts:Suicidal Thoughts: No  Homicidal Thoughts:Homicidal Thoughts: No   Sensorium  Memory: Immediate Good; Recent Good; Remote Good  Judgment: Fair  Insight: Fair   Chartered certified accountant: Fair  Attention Span: Fair  Recall: Fair  Fund of Knowledge: Fair  Language: Good   Psychomotor Activity  Psychomotor Activity: Psychomotor Activity: Normal   Assets  Assets: Desire for Improvement; Communication Skills; Financial Resources/Insurance; Housing; Physical Health; Resilience   Sleep  Sleep: Sleep: Fair   Nutritional Assessment (For OBS and FBC admissions only) Has the patient had a weight loss or gain  of 10 pounds or more in the last 3 months?: No Has the patient had a decrease in food intake/or appetite?: No Does the patient have dental problems?: No Does the patient have eating habits or behaviors that may be indicators of an eating disorder including binging or  inducing vomiting?: No Has the patient recently lost weight without trying?: 2.0 Has the patient been eating poorly because of a decreased appetite?: 0 Malnutrition Screening Tool Score: 2    Physical Exam Vitals reviewed.  Constitutional:      Appearance: Normal appearance. He is diaphoretic.  HENT:     Head: Normocephalic and atraumatic.  Cardiovascular:     Rate and Rhythm: Normal rate.  Pulmonary:     Effort: Pulmonary effort is normal.  Neurological:     General: No focal deficit present.     Mental Status: He is alert and oriented to person, place, and time.    Review of Systems  Constitutional:  Negative for chills and fever.  Respiratory:  Negative for shortness of breath.   Cardiovascular:  Negative for chest pain and palpitations.  Gastrointestinal:  Negative for nausea and vomiting.  Neurological:  Negative for headaches.    There were no vitals taken for this visit. There is no height or weight on file to calculate BMI.  Past Psychiatric History: Opioid use disorder, cocaine use disorder, alcohol use; denies seeing a psychiatrist or therapist; denies current psychotropic medications  Is the patient at risk to self? No  Has the patient been a risk to self in the past 6 months? No .    Has the patient been a risk to self within the distant past? No   Is the patient a risk to others? No   Has the patient been a risk to others in the past 6 months? No   Has the patient been a risk to others within the distant past? No   Past Medical History: Denies Family History: Father-schizophrenia Social History: Patient currently lives with his father who has schizophrenia and is disabled.  He works part-time in a Geologist, engineering.  He denies any legal obligations.  He requests to be transferred to a residential treatment program after detoxing from substances.  Last Labs:  Admission on 08/06/2023, Discharged on 08/06/2023  Component Date Value Ref Range Status   WBC  08/06/2023 8.2  4.0 - 10.5 K/uL Final   RBC 08/06/2023 4.79  4.22 - 5.81 MIL/uL Final   Hemoglobin 08/06/2023 13.3  13.0 - 17.0 g/dL Final   HCT 11/91/4782 41.6  39.0 - 52.0 % Final   MCV 08/06/2023 86.8  80.0 - 100.0 fL Final   MCH 08/06/2023 27.8  26.0 - 34.0 pg Final   MCHC 08/06/2023 32.0  30.0 - 36.0 g/dL Final   RDW 95/62/1308 13.5  11.5 - 15.5 % Final   Platelets 08/06/2023 320  150 - 400 K/uL Final   nRBC 08/06/2023 0.0  0.0 - 0.2 % Final   Neutrophils Relative % 08/06/2023 76  % Final   Neutro Abs 08/06/2023 6.2  1.7 - 7.7 K/uL Final   Lymphocytes Relative 08/06/2023 18  % Final   Lymphs Abs 08/06/2023 1.5  0.7 - 4.0 K/uL Final   Monocytes Relative 08/06/2023 4  % Final   Monocytes Absolute 08/06/2023 0.4  0.1 - 1.0 K/uL Final   Eosinophils Relative 08/06/2023 1  % Final   Eosinophils Absolute 08/06/2023 0.1  0.0 - 0.5 K/uL Final   Basophils Relative 08/06/2023 1  %  Final   Basophils Absolute 08/06/2023 0.0  0.0 - 0.1 K/uL Final   Immature Granulocytes 08/06/2023 0  % Final   Abs Immature Granulocytes 08/06/2023 0.02  0.00 - 0.07 K/uL Final   Performed at [facility], [address], [place], [place] [address]   Sodium 08/06/2023 139  135 - 145 mmol/L Final   Potassium 08/06/2023 4.7  3.5 - 5.1 mmol/L Final   Chloride 08/06/2023 100  98 - 111 mmol/L Final   CO2 08/06/2023 29  22 - 32 mmol/L Final   Glucose, Bld 08/06/2023 82  70 - 99 mg/dL Final   Glucose reference range applies only to samples taken after fasting for at least 8 hours.   BUN 08/06/2023 15  6 - 20 mg/dL Final   Creatinine, Ser 08/06/2023 0.97  0.61 - 1.24 mg/dL Final   Calcium 47/42/5956 9.9  8.9 - 10.3 mg/dL Final   Total Protein 38/75/6433 7.5  6.5 - 8.1 g/dL Final   Albumin 29/51/8841 4.2  3.5 - 5.0 g/dL Final   AST 66/01/3015 34  15 - 41 U/L Final   ALT 08/06/2023 40  0 - 44 U/L Final   Alkaline Phosphatase 08/06/2023 81  38 - 126 U/L Final   Total Bilirubin 08/06/2023 1.0  0.0 - 1.2 mg/dL  Final   GFR, Estimated 08/06/2023 >60  >60 mL/min Final   Comment: (NOTE) Calculated using the CKD-EPI Creatinine Equation (2021)    Anion gap 08/06/2023 10  5 - 15 Final   Performed at [facility], [address] [place], [place] [address]   Magnesium 08/06/2023 2.3  1.7 - 2.4 mg/dL Final   Performed at [facility], [address], [place], [place] [address]   Cholesterol 08/06/2023 171  0 - 200 mg/dL Final   Triglycerides 32/20/2542 51  <150 mg/dL Final   HDL 70/62/3762 64  >40 mg/dL Final   Total CHOL/HDL Ratio 08/06/2023 2.7  RATIO Final   VLDL 08/06/2023 10  0 - 40 mg/dL Final   LDL Cholesterol 08/06/2023 97  0 - 99 mg/dL Final   Comment:        Total Cholesterol/HDL:CHD Risk Coronary Heart Disease Risk Table                     Men   Women  1/2 Average Risk   3.4   3.3  Average Risk       5.0   4.4  2 X Average Risk   9.6   7.1  3 X Average Risk  23.4   11.0        Use the calculated Patient Ratio above and the CHD Risk Table to determine the patient's CHD Risk.        ATP III CLASSIFICATION (LDL):  <100     mg/dL   Optimal  831-517  mg/dL   Near or Above                    Optimal  130-159  mg/dL   Borderline  616-073  mg/dL   High  >710     mg/dL   Very High Performed at [facility], [address] [place], [place] [address]    TSH 08/06/2023 0.298 (L)  0.350 - 4.500 uIU/mL Final   Comment: Performed by a 3rd Generation assay with a functional sensitivity of <=0.01 uIU/mL. Performed at [facility], [address],  [place], [place] [address]    POC Amphetamine UR 08/06/2023 None Detected  NONE DETECTED (Cut Off Level 1000 ng/mL) Final   POC Secobarbital (BAR) 08/06/2023 None Detected  NONE DETECTED (Cut Off Level 300 ng/mL) Final   POC Buprenorphine (BUP) 08/06/2023 Positive (A)  NONE DETECTED (Cut Off Level 10 ng/mL) Final   POC Oxazepam (BZO) 08/06/2023 None Detected  NONE DETECTED (Cut Off Level 300 ng/mL) Final   POC Cocaine UR  08/06/2023 Positive (A)  NONE DETECTED (Cut Off Level 300 ng/mL) Final   POC Methamphetamine UR 08/06/2023 None Detected  NONE DETECTED (Cut Off Level 1000 ng/mL) Final   POC Morphine 08/06/2023 None Detected  NONE DETECTED (Cut Off Level 300 ng/mL) Final   POC Methadone UR 08/06/2023 None Detected  NONE DETECTED (Cut Off Level 300 ng/mL) Final   POC Oxycodone UR 08/06/2023 None Detected  NONE DETECTED (Cut Off Level 100 ng/mL) Final   POC Marijuana UR 08/06/2023 Positive (A)  NONE DETECTED (Cut Off Level 50 ng/mL) Final    Allergies: Seasonal ic [octacosanol]  Medications:  Facility Ordered Medications  Medication   acetaminophen (TYLENOL) tablet 650 mg   alum & mag hydroxide-simeth (MAALOX/MYLANTA) 200-200-20 MG/5ML suspension 30 mL   cloNIDine (CATAPRES) tablet 0.1 mg   Followed by   Melene Muller ON 08/08/2023] cloNIDine (CATAPRES) tablet 0.1 mg   Followed by   Melene Muller ON 08/11/2023] cloNIDine (CATAPRES) tablet 0.1 mg   dicyclomine (BENTYL) tablet 20 mg   loperamide (IMODIUM) capsule 2-4 mg   magnesium hydroxide (MILK OF MAGNESIA) suspension 30 mL   methocarbamol (ROBAXIN) tablet 500 mg   naproxen (NAPROSYN) tablet 500 mg   ondansetron (ZOFRAN-ODT) disintegrating tablet 4 mg   traZODone (DESYREL) tablet 50 mg   hydrOXYzine (ATARAX) tablet 25 mg   nicotine (NICODERM CQ - dosed in mg/24 hours) patch 14 mg    Long Term Goals: Improvement in symptoms so as ready for discharge  Short Term Goals: Patient will verbalize feelings in meetings with treatment team members., Patient will attend at least of 50% of the groups daily., Patient will score a low risk of violence for 24 hours prior to discharge, and Patient will take medications as prescribed daily.  Medical Decision Making   Opioid Use Disorder (heroin, fentanyl, Suboxone-obtaining from the street) -COWS monitoring -Clonidine taper -Tylenol 650 mg every 6 hours as needed for mild pain -Naproxen 500 mg BID as needed for pain -Bentyl  20 mg every 6 hours as needed for spasms/abdominal cramping -Robaxin 500 mg every 8 hours as needed for muscle spasms -Zofran 4 mg every 6 hours as needed for nausea or vomiting -Imodium 2 to 4 mg as needed for diarrhea or loose stools -Maalox/Mylanta 30 mL every 4 hours as needed for indigestion -Milk of Mag 30 mL as needed for constipation  -Hydroxyzine 25 mg q6h PRN for anxiety -Trazodone 50 mg qhs PRN for sleep  Stimulant Use Disorder (cocaine type) -Encourage cessation -PRN Tylenol, maalox/mylanta, milk of magnesia  Tobacco Use Disorder -NRT ordered -Encourage smoking cessation   Recommendations  Based on my evaluation the patient does not appear to have an emergency medical condition.  Lance Muss, MD 08/06/23  2:11 PM
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
## 2023-08-06 NOTE — ED Provider Notes (Signed)
Behavioral Health Urgent Care Medical Screening Exam  Patient Name: Brett Gilbert MRN: [id number] Date of Evaluation: 08/06/23 Chief Complaint:  Requesting fentanyl/heroin detox Diagnosis:  Final diagnoses:  Opioid use disorder    History of Present illness: NICHOLES Gilbert is a 37 y.o. male patient presented to [facility] as a walk in unaccompanied requesting fentanyl/heroin detox  YEZEN Gilbert, 37 y.o., male patient seen face to face by this provider, consulted with Dr. Enedina Finner; and chart reviewed on 08/06/23.    On evaluation Brett Gilbert reports he has been snorting fentanyl/heroin for the past 6 years.  He is consuming roughly 1/2 a gram daily.  His last use was yesterday.  He is also consuming alcohol and snorting cocaine 1-2 times per week.  He is unable to state how much alcohol.  He is snorting roughly $60 worth of cocaine at a time.  He endorses occasional cigarette use.  He has presented to [facility] in the past and states he was sent to [facility] in [place].  He stayed at [facility] for roughly 11 days for detox and was then discharged.  He has no outpatient psychiatric resources in place at this time.  He has never participated in a residential treatment program.  He currently lives with his father who has schizophrenia and is disabled.  He works part-time with a [employer]ologist, engineering.  He denies being diagnosed with any psychiatric diagnoses.  He currently takes no medication and is denying any medical concerns.  He identifies anxiety as his only withdrawal symptoms at this time.  He denies any legal trouble or upcoming court dates.  He is seeking heroin/fentanyl detox in hopes of being transferred to a residential treatment program.  During evaluation Brett CASALINO is observed sitting in the assessment room in no acute distress.  He is disheveled and malodorous.  He is alert/oriented x 4, calm, cooperative, and fairly attentive.  His speech is clear, coherent but at a decreased rate  and tone.  He denies any depressive symptoms and states, "I have hope that things will get better".  However he has a flat affect.  He denies any concerns with appetite or sleep.  He adamantly denies SI/HI/AVH.  He does not appear to be responding to internal/external stimuli.  He does not appear psychotic or manic.  He is able to answer questions appropriately.  Discussed admission to the [facility] and patient is agreeable.    Flowsheet Row ED from 08/06/2023 in [facility] ED from 12/21/2022 in [facility] Emergency Department at [facility] ED from 06/23/2022 in [facility]  C-SSRS RISK CATEGORY No Risk No Risk No Risk       Psychiatric Specialty Exam  Presentation  General Appearance:Disheveled  Eye Contact:Fair  Speech:Clear and Coherent; Normal Rate  Speech Volume:Decreased  Handedness:Right   Mood and Affect  Mood: Anxious  Affect: Congruent   Thought Process  Thought Processes: Coherent  Descriptions of Associations:Intact  Orientation:Full (Time, Place and Person)  Thought Content:Logical  Diagnosis of Schizophrenia or Schizoaffective disorder in past: No data recorded  Hallucinations:None  Ideas of Reference:None  Suicidal Thoughts:No  Homicidal Thoughts:No   Sensorium  Memory: Immediate Good; Recent Good; Remote Good  Judgment: Fair  Insight: Fair   Chartered certified accountant: Fair  Attention Span: Fair  Recall: Fair  Fund of Knowledge: Fair  Language: Good   Psychomotor Activity  Psychomotor Activity: Normal   Assets  Assets: Desire  for Improvement; Communication Skills; Financial Resources/Insurance; Housing; Physical Health; Resilience   Sleep  Sleep: Fair  Number of hours: No data recorded  Physical Exam: Physical Exam Vitals and nursing note reviewed.  Constitutional:      Appearance: Normal appearance.  HENT:     Left Ear: External  ear normal.  Eyes:     General:        Right eye: No discharge.        Left eye: No discharge.  Cardiovascular:     Rate and Rhythm: Normal rate.  Musculoskeletal:        General: Normal range of motion.     Cervical back: Normal range of motion.  Skin:    Coloration: Skin is not jaundiced or pale.  Neurological:     Mental Status: He is alert and oriented to person, place, and time.  Psychiatric:        Attention and Perception: Attention and perception normal.        Mood and Affect: Mood is anxious.        Speech: Speech normal.        Behavior: Behavior normal. Behavior is cooperative.        Thought Content: Thought content normal.        Cognition and Memory: Cognition normal.        Judgment: Judgment normal.    Review of Systems  Constitutional: Negative.   HENT: Negative.    Eyes: Negative.   Respiratory: Negative.    Cardiovascular: Negative.   Musculoskeletal: Negative.   Skin: Negative.   Neurological: Negative.   Psychiatric/Behavioral:  Positive for substance abuse. The patient is nervous/anxious.    Blood pressure (!) 144/89, pulse (!) 59, temperature 97.8 F (36.6 C), temperature source Oral, resp. rate 20, SpO2 100%. There is no height or weight on file to calculate BMI.  Musculoskeletal: Strength & Muscle Tone: within normal limits Gait & Station: normal Patient leans: N/A   [facility] MSE Discharge Disposition for Follow up and Recommendations: Based on my evaluation I certify that psychiatric inpatient services furnished can reasonably be expected to improve the patient's condition which I recommend transfer to an appropriate accepting facility.    Patient is recommended for treatment in the facility based crisis unit for heroin/fentanyl detox.  Patient interested in residential substance abuse treatment upon discharge.  Medications:   Clonidine taper for opioid detox    acetaminophen (TYLENOL) tablet 650 mg Q6H PRN   alum & mag hydroxide-simeth  (MAALOX/MYLANTA) 200-200-20 MG/5ML suspension 30 mL   magnesium hydroxide (MILK OF MAGNESIA) suspension 30 mL every day PRN   traZODone (DESYREL) tablet 50 mg at bedtime PRN   dicyclomine (BENTYL) tablet 20 mg Q6H PRN   hydrOXYzine (ATARAX) tablet 25 mgQH6 PRN   loperamide (IMODIUM) capsule 2-4 mg PRN   methocarbamol (ROBAXIN) tablet 500 mg Q8H PRN   naproxen (NAPROSYN) tablet 500 mgBID PRN   ondansetron (ZOFRAN-ODT) disintegrating tablet 4 mgQH6 PRN    Lab Orders         CBC with Differential/Platelet         Comprehensive metabolic panel         Hemoglobin A1c         Magnesium         Ethanol         Lipid panel         TSH         POCT Urine Drug Screen - (I-Screen)  EKG    Ardis Hughs, NP 08/06/2023, 11:57 AM

## 2023-08-07 DIAGNOSIS — F1410 Cocaine abuse, uncomplicated: Secondary | ICD-10-CM

## 2023-08-07 NOTE — Discharge Instructions (Addendum)
[facility] [address] [place], [place], [address] [phone] phone  New Patient Assessment/Therapy Walk-Ins:  Monday and Wednesday: 8 am until slots are full. Every 1st and 2nd Fridays of the month: 1 pm - 5 pm.  NO ASSESSMENT/THERAPY WALK-INS ON TUESDAYS OR THURSDAYS  New Patient Assessment/Medication Management Walk-Ins:  Monday - Friday:  8 am - 11 am.  For all walk-ins, we ask that you arrive by 7:30 am because patients will be seen in the order of arrival.  Availability is limited; therefore, you may not be seen on the same day that you walk-in.  Our goal is to serve and meet the needs of our community to the best of our ability.  SUBSTANCE USE TREATMENT for Medicaid and State Funded/IPRS  [facility] [address], [place], [address] [phone] phone NOTE: [facility] is no longer offering IOP services.  Serves those who are low-income or have no insurance.  [facility] [address], [place], [place], [address] [phone] phone [phone] fax NOTE: Does have Substance Abuse-Intensive Outpatient Program (SAIOP) as well as transitional housing if eligible.  [facility] [address] [place], [place], [address] [phone] phone [phone] fax  [facility] [address] [place], [place], [address] [phone] phone [phone] fax  HALFWAY HOUSES:  [facility] [phone]  [facility] www.oxfordvacancies.com  12 STEP PROGRAMS:  Alcoholics Anonymous of [place] https://aagreensboronc.com/meeting  Narcotics Anonymous of [place] https://greensborona.org/meetings/  Al-Anon of [place], [place] www.greens[place]alanon.org/find-meetings.html  Nar-Anon https://nar-anon.org/find-a-meetin  List of Residential placements:   [facility] in [place]: [phone]  [facility]: [phone]  [facility]: [place], [place]  [phone]: Male and male facility; 30-day program: (uninsured and Medicaid such as [facility], [facility], [facility], partners)  [facility]: [phone]; men and women's facility; 28 days; Can have Medicaid tailored plan ([facility] or [facility])  [facility]: [phone] [place] or [place]; 28 day program; must be fully detox; tailored Medicaid or no insurance  [facility] in [place], [place]; [phone]; 28 day all males program; no insurance accepted  [facility] in [place]: Joe [phone] (no insurance or Medicaid only); 90 days; outpatient services but provide housing in apartments [place]  [facility] Admission: [phone]: Patient must complete phone screening for placement: [place], [place]; 6 month program; uninsured, Medicaid, and [facility] insurance.   [facility]: no insurance required; [phone]  [place] [facility]: [phone]; Intake: Robert; Must fill out application online; Victor Delay [phone] x [phone]  [facility] in [place], [place]: [phone]; Admissions Coordinators Mr. Dennis or David Gibson; 90 day program.  [facility]: [place], [place] [phone]; Co-Ed 9 month to a year program; Online application; Men entry fee is $500 (6-12months);  [facility]: [address] [place], [place] [address]; no fee or insurance required; minimum of 2 years; Highly structured; work based; Intake Coordinator is Chris [phone]  [facility] in [place], [place]: [phone]; Fax number is [phone]; website: [facility].org; Requires 3-6 page autobiography; 2 year program (18 months and then 6month transitional housing); Admission fee is $300; no insurance needed; work program  [facility] in [place], [place]: Front Desk Staff: Reeci [phone]: They have a Men's Regenerations Program 6-9months. Free program; There is an initial $300 fee however, they are willing to work  with patients regarding that. Application is online.  [facility]: Admissions [phone] Benjamin Cox ext [phone]; Any 7-90 day program is out of pocket; 12   month program is free of charge; there is a $275 entry fee; Patient is responsible for own transportation 

## 2023-08-07 NOTE — ED Notes (Signed)
 Patient left AMA. Belongings returned from locker #23 complete and intact. Staff escorted patient to lobby, safety maintained.

## 2023-08-07 NOTE — ED Provider Notes (Addendum)
 FBC/OBS ASAP Discharge Summary  Date and Time: 08/07/2023 4:00 PM  Name: Brett Gilbert  MRN:  [id number]   Discharge Diagnoses:  Final diagnoses:  Cocaine use disorder (HCC)  Heroin use disorder, severe (HCC)  Tobacco use disorder    Subjective: Brett Gilbert is a 37 y.o. male with a past psychiatric history of opioid use disorder (fentanyl  and heroin) and stimulant use disorder cocaine type who presents to the [facility] for detox and substance use services.   Patient seen in in the milieu watching TV, no acute distress. Patient reports feeling tired today. Patient reports poor sleep and reports having difficulty falling asleep.  He reports having fair appetite. Regarding withdrawal symptoms, he denies. Regarding cravings, he reports having craving all the time and his motivating factors to not give in to the cravings include being in the [facility] and not having money. Patient denies current SI, HI, and AVH. Regarding discharge plans, he is agreeable to going to residential.   Patient later informed staff that he is wanting to return home today. Patient was informed that he is accepted to  [facility] treatment center for 1/3 and we recommended patient to stay in the [facility] until he can be transported to [facility]. Patient refuses and is discharging AMA.   Stay Summary: The patient was evaluated each day by a clinical provider to ascertain response to treatment. Improvement was noted by the patient's report of decreasing symptoms, improved sleep and appetite, affect, medication tolerance, behavior, and participation in unit programming.  Patient was asked each day to complete a self inventory noting mood, mental status, pain, new symptoms, anxiety and concerns.  The patient's medications were managed with the following directions: Opioid Use Disorder (heroin, fentanyl , Suboxone-obtaining from the street) -COWS monitoring -Clonidine  taper -Tylenol  650 mg every 6 hours as needed for mild pain -Naproxen   500 mg BID as needed for pain -Bentyl  20 mg every 6 hours as needed for spasms/abdominal cramping -Robaxin  500 mg every 8 hours as needed for muscle spasms -Zofran  4 mg every 6 hours as needed for nausea or vomiting -Imodium  2 to 4 mg as needed for diarrhea or loose stools -Maalox/Mylanta 30 mL every 4 hours as needed for indigestion -Milk of Mag 30 mL as needed for constipation  -Hydroxyzine  25 mg q6h PRN for anxiety -Trazodone  50 mg qhs PRN for sleep   Stimulant Use Disorder (cocaine type) -Encourage cessation -PRN Tylenol , maalox/mylanta, milk of magnesia   Tobacco Use Disorder -NRT ordered -Encourage smoking cessation  Patient responded well to medication and being in a therapeutic and supportive environment. Positive and appropriate behavior was noted and the patient was motivated for recovery. The patient worked closely with the treatment team and case manager to develop a discharge plan with appropriate goals. Coping skills, problem solving as well as relaxation therapies were also part of the unit programming.   Total Time spent with patient: 30 minutes  Past Psychiatric History: Opioid use disorder, cocaine use disorder, alcohol use; denies seeing a psychiatrist or therapist; denies current psychotropic medications Past Medical History: Denies Family History: Father-schizophrenia Social History: Patient currently lives with his father who has schizophrenia and is disabled.  He works part-time in a geologist, engineering.  He denies any legal obligations.  He requests to be transferred to a residential treatment program after detoxing from substances. Tobacco Cessation:  Prescription not provided because: patient leaving AMA  Current Medications:  Current Facility-Administered Medications  Medication Dose Route Frequency Provider Last Rate Last Admin  acetaminophen  (TYLENOL ) tablet 650 mg  650 mg Oral Q6H PRN Coleman, Carolyn H, NP   650 mg at 08/07/23 1055   alum & mag  hydroxide-simeth (MAALOX/MYLANTA) 200-200-20 MG/5ML suspension 30 mL  30 mL Oral Q4H PRN Coleman, Carolyn H, NP       cloNIDine  (CATAPRES ) tablet 0.1 mg  0.1 mg Oral QID Coleman, Carolyn H, NP   0.1 mg at 08/07/23 1505   Followed by   NOREEN ON 08/08/2023] cloNIDine  (CATAPRES ) tablet 0.1 mg  0.1 mg Oral BH-qamhs Mardy Elveria DEL, NP       Followed by   NOREEN ON 08/11/2023] cloNIDine  (CATAPRES ) tablet 0.1 mg  0.1 mg Oral QAC breakfast Coleman, Carolyn H, NP       dicyclomine  (BENTYL ) tablet 20 mg  20 mg Oral Q6H PRN Coleman, Carolyn H, NP   20 mg at 08/06/23 1719   hydrOXYzine  (ATARAX ) tablet 25 mg  25 mg Oral Q6H PRN Melinda Gwinner B, MD   25 mg at 08/06/23 1720   loperamide  (IMODIUM ) capsule 2-4 mg  2-4 mg Oral PRN Mardy Elveria DEL, NP       magnesium  hydroxide (MILK OF MAGNESIA) suspension 30 mL  30 mL Oral Daily PRN Coleman, Carolyn H, NP       methocarbamol  (ROBAXIN ) tablet 500 mg  500 mg Oral Q8H PRN Coleman, Carolyn H, NP   500 mg at 08/06/23 1720   naproxen  (NAPROSYN ) tablet 500 mg  500 mg Oral BID PRN Coleman, Carolyn H, NP   500 mg at 08/06/23 1720   nicotine  (NICODERM CQ  - dosed in mg/24 hours) patch 14 mg  14 mg Transdermal Daily Nehal Witting B, MD   14 mg at 08/06/23 1704   ondansetron  (ZOFRAN -ODT) disintegrating tablet 4 mg  4 mg Oral Q6H PRN Coleman, Carolyn H, NP   4 mg at 08/06/23 1720   traZODone  (DESYREL ) tablet 50 mg  50 mg Oral QHS PRN Coleman, Carolyn H, NP   50 mg at 08/06/23 2123   No current outpatient medications on file.    PTA Medications:  Facility Ordered Medications  Medication   acetaminophen  (TYLENOL ) tablet 650 mg   alum & mag hydroxide-simeth (MAALOX/MYLANTA) 200-200-20 MG/5ML suspension 30 mL   cloNIDine  (CATAPRES ) tablet 0.1 mg   Followed by   NOREEN ON 08/08/2023] cloNIDine  (CATAPRES ) tablet 0.1 mg   Followed by   NOREEN ON 08/11/2023] cloNIDine  (CATAPRES ) tablet 0.1 mg   dicyclomine  (BENTYL ) tablet 20 mg   loperamide  (IMODIUM ) capsule 2-4 mg    magnesium  hydroxide (MILK OF MAGNESIA) suspension 30 mL   methocarbamol  (ROBAXIN ) tablet 500 mg   naproxen  (NAPROSYN ) tablet 500 mg   ondansetron  (ZOFRAN -ODT) disintegrating tablet 4 mg   traZODone  (DESYREL ) tablet 50 mg   hydrOXYzine  (ATARAX ) tablet 25 mg   nicotine  (NICODERM CQ  - dosed in mg/24 hours) patch 14 mg       08/07/2023    3:59 PM 08/06/2023    1:59 PM 06/23/2022    1:39 PM  Depression screen PHQ 2/9  Decreased Interest 3 3 0  Down, Depressed, Hopeless 2 2 0  PHQ - 2 Score 5 5 0  Altered sleeping 3 3   Tired, decreased energy 3 3   Change in appetite 3 3   Feeling bad or failure about yourself  1 1   Trouble concentrating 2 2   Moving slowly or fidgety/restless 0 0   Suicidal thoughts 0 0   PHQ-9 Score 17 17  Flowsheet Row ED from 08/06/2023 in [facility] Most recent reading at 08/06/2023  1:50 PM ED from 08/06/2023 in [facility] Most recent reading at 08/06/2023  9:21 AM ED from 12/21/2022 in [facility] Emergency Department at [facility] Most recent reading at 12/21/2022  4:02 PM  C-SSRS RISK CATEGORY No Risk No Risk No Risk       Musculoskeletal  Strength & Muscle Tone: within normal limits Gait & Station: normal Patient leans: N/A  Psychiatric Specialty Exam  Presentation  General Appearance:  Appropriate for Environment; Disheveled  Eye Contact: Fair  Speech: Clear and Coherent; Normal Rate  Speech Volume: Normal  Handedness: Right   Mood and Affect  Mood: Depressed  Affect: Congruent   Thought Process  Thought Processes: Coherent; Linear  Descriptions of Associations:Intact  Orientation:Full (Time, Place and Person)  Thought Content:Logical; WDL  Diagnosis of Schizophrenia or Schizoaffective disorder in past: No    Hallucinations:Hallucinations: None  Ideas of Reference:None  Suicidal Thoughts:Suicidal Thoughts: No  Homicidal Thoughts:Homicidal  Thoughts: No   Sensorium  Memory: Remote Good  Judgment: Impaired  Insight: Fair   Art Therapist  Concentration: Good  Attention Span: Good  Recall: Good  Fund of Knowledge: Good  Language: Good   Psychomotor Activity  Psychomotor Activity: Psychomotor Activity: Normal   Assets  Assets: Communication Skills; Resilience   Sleep  Sleep: Sleep: Fair   Nutritional Assessment (For OBS and FBC admissions only) Has the patient had a weight loss or gain of 10 pounds or more in the last 3 months?: No Has the patient had a decrease in food intake/or appetite?: No Does the patient have dental problems?: No Does the patient have eating habits or behaviors that may be indicators of an eating disorder including binging or inducing vomiting?: No Has the patient recently lost weight without trying?: 2.0 Has the patient been eating poorly because of a decreased appetite?: 0 Malnutrition Screening Tool Score: 2    Physical Exam  Physical Exam Vitals reviewed.  Constitutional:      Appearance: Normal appearance.  HENT:     Head: Normocephalic and atraumatic.  Cardiovascular:     Rate and Rhythm: Normal rate.  Pulmonary:     Effort: Pulmonary effort is normal.  Neurological:     General: No focal deficit present.     Mental Status: He is alert and oriented to person, place, and time.    Review of Systems  Constitutional:  Negative for chills and fever.  Respiratory:  Negative for shortness of breath.   Cardiovascular:  Negative for chest pain and palpitations.  Gastrointestinal:  Negative for nausea and vomiting.  Neurological:  Negative for headaches.   Blood pressure (!) 143/91, pulse 87, temperature 97.9 F (36.6 C), temperature source Oral, resp. rate 18, SpO2 100%. There is no height or weight on file to calculate BMI.  Demographic Factors:  Male and Low socioeconomic status  Loss Factors: Financial problems/change in socioeconomic  status  Historical Factors: Impulsivity  Risk Reduction Factors:   Living with another person, especially a relative  Continued Clinical Symptoms:  Alcohol/Substance Abuse/Dependencies  Cognitive Features That Contribute To Risk:  Closed-mindedness    Suicide Risk:  Mild:  Suicidal ideation of limited frequency, intensity, duration, and specificity.  There are no identifiable plans, no associated intent, mild dysphoria and related symptoms, good self-control (both objective and subjective assessment), few other risk factors, and identifiable protective factors, including available and accessible social support.  Plan Of Care/Follow-up recommendations:  Activity as tolerated Regular diet Continue prescription medications See PCP for medical conditions   Disposition: Home with resources in [facility]; leaving AMA  Ismael KATHEE Franco, MD 08/07/2023, 4:00 PM

## 2023-08-07 NOTE — ED Notes (Signed)
Patient in the bedroom sleeping. NAD. Respirations are even and unlabored. Will continue to monitor for safety.

## 2023-08-07 NOTE — Discharge Planning (Signed)
 LCSW followed up with [facility] Admissions Coordinator Brittany who reports patient would be a good candidate for their treatment facility. Per Brittany, transportation can be arranged via [facility] for the patient to arrive on Friday 08/10/2023. LCSW spoke with patient to confirm if he is in agreement with plan. Patient is agreeable to plan and expressed appreciation for LCSW assistance. Driver will call the morning of regarding ETA. No other needs were reported by patient or facility. 30 day script needed and medications can be filled at their facility.   Merlynn Lazier, LCSW Clinical Social Worker [facility] Ph: [phone]

## 2023-08-07 NOTE — ED Notes (Signed)
 Patient sitting in bedroom, calm and collected. No acute distress noted. No concerns voiced. Informed patient to notify staff with any needs or assistance. Patient verbalized understanding or agreement. Safety checks in place per facility policy.

## 2023-08-07 NOTE — ED Notes (Signed)
PRN Tylenol given for back pain 8/10. Medication administered with no complications. Environment secured, safety checks in place per facility policy.

## 2023-08-07 NOTE — ED Provider Notes (Signed)
 Behavioral Health Progress Note  Date and Time: 08/07/2023 10:00 AM Name: Brett Gilbert MRN:  [id number]  Subjective:  Brett Gilbert is a 37 y.o. male with a past psychiatric history of opioid use disorder (fentanyl  and heroin) and stimulant use disorder cocaine type who presents to the [facility] for detox and substance use services.   Patient seen in in the milieu watching TV, no acute distress. Patient reports feeling tired today. Patient reports poor sleep and reports having difficulty falling asleep.  He reports having fair appetite. Regarding withdrawal symptoms, he denies. Regarding cravings, he reports having craving all the time and his motivating factors to not give in to the cravings include being in the [facility] and not having money. Patient denies current SI, HI, and AVH. Regarding discharge plans, he is agreeable to going to residential.   Diagnosis:  Final diagnoses:  Cocaine use disorder (HCC)  Heroin use disorder, severe (HCC)  Tobacco use disorder    Total Time spent with patient: 20 minutes  Past Psychiatric History: Opioid use disorder, cocaine use disorder, alcohol use; denies seeing a psychiatrist or therapist; denies current psychotropic medications Past Medical History: Denies Family History: Father-schizophrenia Social History: Patient currently lives with his father who has schizophrenia and is disabled.  He works part-time in a geologist, engineering.  He denies any legal obligations.  He requests to be transferred to a residential treatment program after detoxing from substances.   Current Medications:  Current Facility-Administered Medications  Medication Dose Route Frequency Provider Last Rate Last Admin   acetaminophen  (TYLENOL ) tablet 650 mg  650 mg Oral Q6H PRN Coleman, Carolyn H, NP       alum & mag hydroxide-simeth (MAALOX/MYLANTA) 200-200-20 MG/5ML suspension 30 mL  30 mL Oral Q4H PRN Coleman, Carolyn H, NP       cloNIDine  (CATAPRES ) tablet 0.1 mg  0.1 mg  Oral QID Coleman, Carolyn H, NP   0.1 mg at 08/06/23 2123   Followed by   NOREEN ON 08/08/2023] cloNIDine  (CATAPRES ) tablet 0.1 mg  0.1 mg Oral BH-qamhs Mardy Elveria DEL, NP       Followed by   NOREEN ON 08/11/2023] cloNIDine  (CATAPRES ) tablet 0.1 mg  0.1 mg Oral QAC breakfast Coleman, Carolyn H, NP       dicyclomine  (BENTYL ) tablet 20 mg  20 mg Oral Q6H PRN Coleman, Carolyn H, NP   20 mg at 08/06/23 1719   hydrOXYzine  (ATARAX ) tablet 25 mg  25 mg Oral Q6H PRN Leshay Desaulniers B, MD   25 mg at 08/06/23 1720   loperamide  (IMODIUM ) capsule 2-4 mg  2-4 mg Oral PRN Mardy Elveria DEL, NP       magnesium  hydroxide (MILK OF MAGNESIA) suspension 30 mL  30 mL Oral Daily PRN Coleman, Carolyn H, NP       methocarbamol  (ROBAXIN ) tablet 500 mg  500 mg Oral Q8H PRN Coleman, Carolyn H, NP   500 mg at 08/06/23 1720   naproxen  (NAPROSYN ) tablet 500 mg  500 mg Oral BID PRN Coleman, Carolyn H, NP   500 mg at 08/06/23 1720   nicotine  (NICODERM CQ  - dosed in mg/24 hours) patch 14 mg  14 mg Transdermal Daily Daurice Ovando B, MD   14 mg at 08/06/23 1704   ondansetron  (ZOFRAN -ODT) disintegrating tablet 4 mg  4 mg Oral Q6H PRN Coleman, Carolyn H, NP   4 mg at 08/06/23 1720   traZODone  (DESYREL ) tablet 50 mg  50 mg Oral QHS PRN Mardy Elveria DEL,  NP   50 mg at 08/06/23 2123   No current outpatient medications on file.    Labs  Lab Results:  Admission on 08/06/2023  Component Date Value Ref Range Status   Free T4 08/06/2023 1.10  0.61 - 1.12 ng/dL Final   Comment: (NOTE) Biotin ingestion may interfere with free T4 tests. If the results are inconsistent with the TSH level, previous test results, or the clinical presentation, then consider biotin interference. If needed, order repeat testing after stopping biotin. Performed at [facility], [address], [place], [place] [address]   Admission on 08/06/2023, Discharged on 08/06/2023  Component Date Value Ref Range Status   WBC 08/06/2023 8.2  4.0 - 10.5  K/uL Final   RBC 08/06/2023 4.79  4.22 - 5.81 MIL/uL Final   Hemoglobin 08/06/2023 13.3  13.0 - 17.0 g/dL Final   HCT 87/69/7975 41.6  39.0 - 52.0 % Final   MCV 08/06/2023 86.8  80.0 - 100.0 fL Final   MCH 08/06/2023 27.8  26.0 - 34.0 pg Final   MCHC 08/06/2023 32.0  30.0 - 36.0 g/dL Final   RDW 87/69/7975 13.5  11.5 - 15.5 % Final   Platelets 08/06/2023 320  150 - 400 K/uL Final   nRBC 08/06/2023 0.0  0.0 - 0.2 % Final   Neutrophils Relative % 08/06/2023 76  % Final   Neutro Abs 08/06/2023 6.2  1.7 - 7.7 K/uL Final   Lymphocytes Relative 08/06/2023 18  % Final   Lymphs Abs 08/06/2023 1.5  0.7 - 4.0 K/uL Final   Monocytes Relative 08/06/2023 4  % Final   Monocytes Absolute 08/06/2023 0.4  0.1 - 1.0 K/uL Final   Eosinophils Relative 08/06/2023 1  % Final   Eosinophils Absolute 08/06/2023 0.1  0.0 - 0.5 K/uL Final   Basophils Relative 08/06/2023 1  % Final   Basophils Absolute 08/06/2023 0.0  0.0 - 0.1 K/uL Final   Immature Granulocytes 08/06/2023 0  % Final   Abs Immature Granulocytes 08/06/2023 0.02  0.00 - 0.07 K/uL Final   Performed at [facility], [address] [place], [place], [place] [address]   Sodium 08/06/2023 139  135 - 145 mmol/L Final   Potassium 08/06/2023 4.7  3.5 - 5.1 mmol/L Final   Chloride 08/06/2023 100  98 - 111 mmol/L Final   CO2 08/06/2023 29  22 - 32 mmol/L Final   Glucose, Bld 08/06/2023 82  70 - 99 mg/dL Final   Glucose reference range applies only to samples taken after fasting for at least 8 hours.   BUN 08/06/2023 15  6 - 20 mg/dL Final   Creatinine, Ser 08/06/2023 0.97  0.61 - 1.24 mg/dL Final   Calcium 87/69/7975 9.9  8.9 - 10.3 mg/dL Final   Total Protein 87/69/7975 7.5  6.5 - 8.1 g/dL Final   Albumin 87/69/7975 4.2  3.5 - 5.0 g/dL Final   AST 87/69/7975 34  15 - 41 U/L Final   ALT 08/06/2023 40  0 - 44 U/L Final   Alkaline Phosphatase 08/06/2023 81  38 - 126 U/L Final   Total Bilirubin 08/06/2023 1.0  0.0 - 1.2 mg/dL Final   GFR, Estimated  08/06/2023 >60  >60 mL/min Final   Comment: (NOTE) Calculated using the CKD-EPI Creatinine Equation (2021)    Anion gap 08/06/2023 10  5 - 15 Final   Performed at [facility], [address] [place], [place] [address]   Hgb A1c MFr Bld 08/06/2023  6.1 (H)  4.8 - 5.6 % Final   Comment: (NOTE)         Prediabetes: 5.7 - 6.4         Diabetes: >6.4         Glycemic control for adults with diabetes: <7.0    Mean Plasma Glucose 08/06/2023 128  mg/dL Final   Comment: (NOTE) Performed At: [facility] [address] [place], [place] [id number] Jennette Shorter MD Ey:[id number]    Magnesium  08/06/2023 2.3  1.7 - 2.4 mg/dL Final   Performed at [facility], [address], [place], [place] [address]   Alcohol, Ethyl (B) 08/06/2023 <10  <10 mg/dL Final   Comment: (NOTE) Lowest detectable limit for serum alcohol is 10 mg/dL.  For medical purposes only. Performed at [facility], [address], [place], [place] [address]    Cholesterol 08/06/2023 171  0 - 200 mg/dL Final   Triglycerides 87/69/7975 51  <150 mg/dL Final   HDL 87/69/7975 64  >40 mg/dL Final   Total CHOL/HDL Ratio 08/06/2023 2.7  RATIO Final   VLDL 08/06/2023 10  0 - 40 mg/dL Final   LDL Cholesterol 08/06/2023 97  0 - 99 mg/dL Final   Comment:        Total Cholesterol/HDL:CHD Risk Coronary Heart Disease Risk Table                     Men   Women  1/2 Average Risk   3.4   3.3  Average Risk       5.0   4.4  2 X Average Risk   9.6   7.1  3 X Average Risk  23.4   11.0        Use the calculated Patient Ratio above and the CHD Risk Table to determine the patient's CHD Risk.        ATP III CLASSIFICATION (LDL):  <100     mg/dL   Optimal  899-870  mg/dL   Near or Above                    Optimal  130-159  mg/dL   Borderline  839-810  mg/dL   High  >809     mg/dL   Very High Performed at [facility], [address] [place], [place] [address]    TSH 08/06/2023 0.298 (L)  0.350 - 4.500  uIU/mL Final   Comment: Performed by a 3rd Generation assay with a functional sensitivity of <=0.01 uIU/mL. Performed at [facility], [address] [place], [place] [address]    POC Amphetamine UR 08/06/2023 None Detected  NONE DETECTED (Cut Off Level 1000 ng/mL) Final   POC Secobarbital (BAR) 08/06/2023 None Detected  NONE DETECTED (Cut Off Level 300 ng/mL) Final   POC Buprenorphine (BUP) 08/06/2023 Positive (A)  NONE DETECTED (Cut Off Level 10 ng/mL) Final   POC Oxazepam (BZO) 08/06/2023 None Detected  NONE DETECTED (Cut Off Level 300 ng/mL) Final   POC Cocaine UR 08/06/2023 Positive (A)  NONE DETECTED (Cut Off Level 300 ng/mL) Final   POC Methamphetamine UR 08/06/2023 None Detected  NONE DETECTED (Cut Off Level 1000 ng/mL) Final   POC Morphine 08/06/2023 None Detected  NONE DETECTED (Cut Off Level 300 ng/mL) Final   POC Methadone UR 08/06/2023 None Detected  NONE DETECTED (Cut Off Level 300 ng/mL) Final   POC Oxycodone  UR 08/06/2023 None Detected  NONE DETECTED (Cut  Off Level 100 ng/mL) Final   POC Marijuana UR 08/06/2023 Positive (A)  NONE DETECTED (Cut Off Level 50 ng/mL) Final    Blood Alcohol level:  Lab Results  Component Value Date   ETH <10 08/06/2023   ETH <10 06/23/2022    Metabolic Disorder Labs: Lab Results  Component Value Date   HGBA1C 6.1 (H) 08/06/2023   MPG 128 08/06/2023   MPG 122.63 06/23/2022   No results found for: PROLACTIN Lab Results  Component Value Date   CHOL 171 08/06/2023   TRIG 51 08/06/2023   HDL 64 08/06/2023   CHOLHDL 2.7 08/06/2023   VLDL 10 08/06/2023   LDLCALC 97 08/06/2023   LDLCALC 110 (H) 06/23/2022    Therapeutic Lab Levels: No results found for: LITHIUM No results found for: VALPROATE No results found for: CBMZ  Physical Findings   PHQ2-9    Flowsheet Row ED from 08/06/2023 in [facility] ED from 06/23/2022 in [facility]  PHQ-2 Total Score 5 0   PHQ-9 Total Score 17 --      Flowsheet Row ED from 08/06/2023 in [facility] Most recent reading at 08/06/2023  1:50 PM ED from 08/06/2023 in [facility] Most recent reading at 08/06/2023  9:21 AM ED from 12/21/2022 in [facility] Emergency Department at [facility] Most recent reading at 12/21/2022  4:02 PM  C-SSRS RISK CATEGORY No Risk No Risk No Risk        Musculoskeletal  Strength & Muscle Tone: within normal limits Gait & Station: normal Patient leans: N/A  Psychiatric Specialty Exam  Presentation  General Appearance:  Appropriate for Environment; Disheveled  Eye Contact: Fair  Speech: Clear and Coherent; Normal Rate  Speech Volume: Normal  Handedness: Right   Mood and Affect  Mood: Depressed  Affect: Congruent   Thought Process  Thought Processes: Coherent; Linear  Descriptions of Associations:Intact  Orientation:Full (Time, Place and Person)  Thought Content:Logical; WDL  Diagnosis of Schizophrenia or Schizoaffective disorder in past: No    Hallucinations:Hallucinations: None  Ideas of Reference:None  Suicidal Thoughts:Suicidal Thoughts: No  Homicidal Thoughts:Homicidal Thoughts: No   Sensorium  Memory: Remote Good  Judgment: Impaired  Insight: Fair   Art Therapist  Concentration: Good  Attention Span: Good  Recall: Good  Fund of Knowledge: Good  Language: Good   Psychomotor Activity  Psychomotor Activity: Psychomotor Activity: Normal   Assets  Assets: Communication Skills; Resilience   Sleep  Sleep: Sleep: Fair   Nutritional Assessment (For OBS and FBC admissions only) Has the patient had a weight loss or gain of 10 pounds or more in the last 3 months?: No Has the patient had a decrease in food intake/or appetite?: No Does the patient have dental problems?: No Does the patient have eating habits or behaviors that may be  indicators of an eating disorder including binging or inducing vomiting?: No Has the patient recently lost weight without trying?: 2.0 Has the patient been eating poorly because of a decreased appetite?: 0 Malnutrition Screening Tool Score: 2    Physical Exam  Physical Exam Vitals reviewed.  Constitutional:      Appearance: Normal appearance.  HENT:     Head: Normocephalic and atraumatic.  Cardiovascular:     Rate and Rhythm: Normal rate.  Pulmonary:     Effort: Pulmonary effort is normal.  Neurological:     General: No focal deficit present.     Mental Status:  He is alert and oriented to person, place, and time.    Review of Systems  Constitutional:  Negative for chills and fever.  Respiratory:  Negative for shortness of breath.   Cardiovascular:  Negative for chest pain and palpitations.  Gastrointestinal:  Negative for nausea and vomiting.  Neurological:  Negative for headaches.   Blood pressure (!) 142/92, pulse (!) 52, temperature 97.9 F (36.6 C), temperature source Oral, resp. rate 18, SpO2 100%. There is no height or weight on file to calculate BMI.  Treatment Plan Summary: Opioid Use Disorder (heroin, fentanyl , Suboxone-obtaining from the street) -COWS monitoring -Clonidine  taper -Tylenol  650 mg every 6 hours as needed for mild pain -Naproxen  500 mg BID as needed for pain -Bentyl  20 mg every 6 hours as needed for spasms/abdominal cramping -Robaxin  500 mg every 8 hours as needed for muscle spasms -Zofran  4 mg every 6 hours as needed for nausea or vomiting -Imodium  2 to 4 mg as needed for diarrhea or loose stools -Maalox/Mylanta 30 mL every 4 hours as needed for indigestion -Milk of Mag 30 mL as needed for constipation  -Hydroxyzine  25 mg q6h PRN for anxiety -Trazodone  50 mg qhs PRN for sleep   Stimulant Use Disorder (cocaine type) -Encourage cessation -PRN Tylenol , maalox/mylanta, milk of magnesia   Tobacco Use Disorder -NRT ordered -Encourage smoking  cessation   Dispo: Residential pending   Ismael KATHEE Franco, MD 08/07/2023 10:00 AM

## 2023-08-07 NOTE — ED Notes (Signed)
 Patient resting with eyes closed in no apparent acute distress. Respirations even and unlabored. Environment secured. Safety checks in place according to facility policy.

## 2023-08-07 NOTE — ED Notes (Signed)
 Abnormal BP of 143/91 obtained manually. Patient asymptomatic at this time, in no acute distress. Provider Kizzie Ide, MD made aware.

## 2023-08-07 NOTE — Discharge Planning (Signed)
 Patient has been sent to the following facilities for review:   [facility]: Referral sent  [facility]: Referral sent  [facility]: Referral sent  [facility]: Referral sent   [facility]: Referral sent   Updates will be provided as received.   Merlynn Lazier, LCSW Clinical Social Worker [place] Ph: [phone]

## 2023-08-07 NOTE — ED Notes (Signed)
 Abnormal BP obtained. Provider Kizzie Ide, MD made aware.

## 2023-08-07 NOTE — Discharge Planning (Signed)
 LCSW received update from RN that patient is requesting to discharge at this time. LCSW went and spoke with patient regarding his plan to be discharged. Patient stated, I just can't do it. LCSW explored patient's ambivalence regarding next steps and patient stated Bottom line, I just want to go home. Patient reports LCSW can call the facility to inform that he is not coming and reports he would like to discharge home on today. Patient acknowledges that he is not ready and willing to go to treatment. Update provided to the MD. Resources are in the patient's AVS. LCSW to sign off at this time. Please inform if further LCSW needs arise prior to discharge.   LCSW contacted [facility] to inform of patient's choice. Patient has been removed off of their list for admission.   Merlynn Lazier, LCSW Clinical Social Worker [facility] Ph: [phone]

## 2023-08-07 NOTE — ED Notes (Signed)
 Patient alert & oriented x4. Patient initially endorsed thoughts to hurt themselves/others and then quickly went nah, I'm just kidding with a grin. Patient currently denies intent to harm self or others when asked. When asked if patient is seeing or hearing things that are not there patient responded with What like schizophrenia? Writer elaborated and patient denied A/VH. Patient states they have not had a BM in two weeks, writer inquired if they were sure they meant two weeks as that is a long time and patient responded I was on heroin. Writer offered medications to relieve constipation and patient refused. No acute distress noted. Support and encouragement provided. Routine safety checks conducted per facility protocol. Encouraged patient to notify staff if any thoughts of harm towards self or others arise. Patient verbalizes understanding and agreement.

## 2023-08-08 LAB — T3, FREE: T3, Free: 3.3 pg/mL (ref 2.0–4.4)

## 2023-08-20 ENCOUNTER — Emergency Department (HOSPITAL_COMMUNITY)
Admission: EM | Admit: 2023-08-20 | Discharge: 2023-08-21 | Disposition: A | Discharge status: Home / Self Care | Payer: Commercial Managed Care - HMO

## 2023-08-20 ENCOUNTER — Emergency Department (HOSPITAL_COMMUNITY): Payer: Commercial Managed Care - HMO

## 2023-08-20 DIAGNOSIS — R404 Transient alteration of awareness: Secondary | ICD-10-CM | POA: Diagnosis present

## 2023-08-20 DIAGNOSIS — T40601A Poisoning by unspecified narcotics, accidental (unintentional), initial encounter: Secondary | ICD-10-CM

## 2023-08-20 LAB — CBG MONITORING, ED: Glucose-Capillary: 135 mg/dL — ABNORMAL HIGH (ref 70–99)

## 2023-08-20 LAB — BASIC METABOLIC PANEL
Anion gap: 10 (ref 5–15)
BUN: 21 mg/dL — ABNORMAL HIGH (ref 6–20)
CO2: 24 mmol/L (ref 22–32)
Calcium: 9.6 mg/dL (ref 8.9–10.3)
Chloride: 108 mmol/L (ref 98–111)
Creatinine, Ser: 1.15 mg/dL (ref 0.61–1.24)
GFR, Estimated: >60 mL/min (ref >60)
Glucose, Bld: 130 mg/dL — ABNORMAL HIGH (ref 70–99)
Potassium: 4.2 mmol/L (ref 3.5–5.1)
Sodium: 142 mmol/L (ref 135–145)

## 2023-08-20 LAB — CBC
HCT: 40.2 % (ref 39.0–52.0)
Hemoglobin: 13.2 g/dL (ref 13.0–17.0)
MCH: 28.2 pg (ref 26.0–34.0)
MCHC: 32.8 g/dL (ref 30.0–36.0)
MCV: 85.9 fL (ref 80.0–100.0)
Platelets: 226 10*3/uL (ref 150–400)
RBC: 4.68 MIL/uL (ref 4.22–5.81)
RDW: 13.8 % (ref 11.5–15.5)
WBC: 10.6 10*3/uL — ABNORMAL HIGH (ref 4.0–10.5)
nRBC: 0 % (ref 0.0–0.2)

## 2023-08-20 MED ORDER — NALOXONE HCL 0.4 MG/ML IJ SOLN
0.4000 mg | Freq: Once | INTRAMUSCULAR | Status: AC
  Administered 2023-08-20: 0.4 mg via INTRAVENOUS
  Filled 2023-08-20: qty 1

## 2023-08-20 NOTE — ED Triage Notes (Signed)
 Patient brought in by EMS. Was found unresponsive by family. Patient took and unknown substance of drugs. Family stated it was heroin or fentanyl . Per EMS patient HR was 30 the administered Atropine and Hr 80. Patient was given 2mg  of Narcan  IN by family prior to EMS arrival. EMS gave 1mg  Narcan  IN and 1mg  IV. Patient BP elevated with EMS.

## 2023-08-20 NOTE — ED Provider Notes (Signed)
 11:36 PM Assumed care from Dr. Ula, please see their note for full history, physical and decision making until this point. In brief this is a 38 y.o. year old male who presented to the ED tonight with Drug Overdose (Possible Fentanyl  or Heroin )     Narcan  with medics and here. Still sleepy. Pending reeval for sobriety.   Tolerating PO. Speaks with a lisp but otherwise clear and understandable. Wants to be discharged. Narcan  rx provided.   Discharge instructions, including strict return precautions for new or worsening symptoms, given. Patient and/or family verbalized understanding and agreement with the plan as described.   Labs, studies and imaging reviewed by myself and considered in medical decision making if ordered. Imaging interpreted by radiology.  Labs Reviewed  CBC - Abnormal; Notable for the following components:      Result Value   WBC 10.6 (*)    All other components within normal limits  BASIC METABOLIC PANEL - Abnormal; Notable for the following components:   Glucose, Bld 130 (*)    BUN 21 (*)    All other components within normal limits  CBG MONITORING, ED - Abnormal; Notable for the following components:   Glucose-Capillary 135 (*)    All other components within normal limits    CT Head Wo Contrast  Final Result      No follow-ups on file.    Lorette Mayo, MD 08/21/23 [phone]

## 2023-08-20 NOTE — ED Provider Notes (Signed)
 [place] EMERGENCY DEPARTMENT AT [facility] Provider Note   CSN: [id number] Arrival date & time: 08/20/23  2124     History  No chief complaint on file.   KADEN DAUGHDRILL is a 38 y.o. male.  38 year old male with past medical history of polysubstance abuse presenting to the emergency department today after he was found unresponsive by family.  The patient received Narcan  with improvement and did report that he use what he thought was heroin but thinks that there may have been some fentanyl  in it.  The patient was apparently bradycardic in the 30s initially did receive atropine with medics.  He was brought to the ER at that time for further evaluation.        Home Medications Prior to Admission medications   Not on File      Allergies    Seasonal ic [octacosanol]    Review of Systems   Review of Systems  Reason unable to perform ROS: Patient has sonorous.    Physical Exam Updated Vital Signs BP (!) 189/137   Pulse 68   Temp (!) 96.5 F (35.8 C) (Axillary)   Resp (!) 26   SpO2 94%  Physical Exam Vitals and nursing note reviewed.   Gen: Sonorous and will not arouse to painful stimuli Eyes: Pupils 2 mm bilaterally HEENT: no oropharyngeal swelling Neck: trachea midline Resp: clear to auscultation bilaterally Card: RRR, no murmurs, rubs, or gallops Abd: nontender, nondistended Extremities: no calf tenderness, no edema Vascular: 2+ radial pulses bilaterally, 2+ DP pulses bilaterally Skin: no rashes Psyc: acting appropriately   ED Results / Procedures / Treatments   Labs (all labs ordered are listed, but only abnormal results are displayed) Labs Reviewed  CBG MONITORING, ED - Abnormal; Notable for the following components:      Result Value   Glucose-Capillary 135 (*)    All other components within normal limits  CBC  BASIC METABOLIC PANEL    EKG None  Radiology No results found.  Procedures Procedures    Medications Ordered in  ED Medications  naloxone  (NARCAN ) injection 0.4 mg (has no administration in time range)    ED Course/ Medical Decision Making/ A&P                                 Medical Decision Making 38 year old male with past medical history of polysubstance abuse presenting to the emergency department today with appears to be unintentional opiate overdose.  The patient is maintaining his saturations here.  He is difficult to arouse.  I will give the patient additional Narcan  here.  Will obtain basic labs here.  I will monitor the patient here for need for repeat Narcan  or Narcan  drip.  I will reevaluate for ultimate disposition.  On reassessment after the Narcan  the patient will arouse and is able to talk but will fall back asleep.  His pulse ox is stable.  I am unclear if the patient fell or what the circumstances were prior to this I will obtain a CT scan of his head to evaluate for intracranial injury.  Will continue to monitor the patient here.  If remains stable and is more awake and alert I think that he may potentially be able to be discharged.  Signed out to oncoming provider at shift change for ultimate disposition but plan is for discharge if the patient returns to his baseline  Amount and/or Complexity of Data  Reviewed Labs: ordered. Radiology: ordered.  Risk Prescription drug management.           Final Clinical Impression(s) / ED Diagnoses Final diagnoses:  None    Rx / DC Orders ED Discharge Orders     None         Ula Prentice SAUNDERS, MD 08/20/23 2254

## 2023-08-21 MED ORDER — NALOXONE HCL 4 MG/0.1ML NA LIQD: NASAL | 1 refills | Status: DC

## 2023-08-21 NOTE — ED Notes (Signed)
 Patient woke up stating he was ready to go home. Patient also asked was it night time because he had to work in the morning. Patient made aware of his situation. Patient back sleep.

## 2023-08-21 NOTE — ED Notes (Signed)
 Patient alert and oriented x4, gait steady. Patient was given bus pass for a ride home. Reviewed discharge paperwork with patient. Verbalized understanding.

## 2023-09-06 ENCOUNTER — Ambulatory Visit (HOSPITAL_COMMUNITY)
Admission: EM | Admit: 2023-09-06 | Discharge: 2023-09-06 | Disposition: A | Discharge status: Other Acute Inpatient Hospital | Payer: Commercial Managed Care - HMO | Attending: Behavioral Health | Admitting: Behavioral Health

## 2023-09-06 ENCOUNTER — Other Ambulatory Visit (HOSPITAL_COMMUNITY)
Admission: EM | Admit: 2023-09-06 | Discharge: 2023-09-08 | Disposition: A | Discharge status: Home / Self Care | Payer: Commercial Managed Care - HMO | Attending: Psychiatry | Admitting: Behavioral Health

## 2023-09-06 DIAGNOSIS — I517 Cardiomegaly: Secondary | ICD-10-CM | POA: Insufficient documentation

## 2023-09-06 DIAGNOSIS — F1110 Opioid abuse, uncomplicated: Secondary | ICD-10-CM | POA: Insufficient documentation

## 2023-09-06 DIAGNOSIS — F1120 Opioid dependence, uncomplicated: Secondary | ICD-10-CM

## 2023-09-06 DIAGNOSIS — R001 Bradycardia, unspecified: Secondary | ICD-10-CM | POA: Insufficient documentation

## 2023-09-06 DIAGNOSIS — F17200 Nicotine dependence, unspecified, uncomplicated: Secondary | ICD-10-CM

## 2023-09-06 DIAGNOSIS — F1190 Opioid use, unspecified, uncomplicated: Secondary | ICD-10-CM

## 2023-09-06 DIAGNOSIS — Z560 Unemployment, unspecified: Secondary | ICD-10-CM | POA: Insufficient documentation

## 2023-09-06 DIAGNOSIS — F1990 Other psychoactive substance use, unspecified, uncomplicated: Secondary | ICD-10-CM

## 2023-09-06 DIAGNOSIS — F1410 Cocaine abuse, uncomplicated: Secondary | ICD-10-CM | POA: Insufficient documentation

## 2023-09-06 DIAGNOSIS — F1290 Cannabis use, unspecified, uncomplicated: Secondary | ICD-10-CM

## 2023-09-06 DIAGNOSIS — F1420 Cocaine dependence, uncomplicated: Secondary | ICD-10-CM

## 2023-09-06 LAB — CBC WITH DIFFERENTIAL/PLATELET
Abs Immature Granulocytes: 0.01 10*3/uL (ref 0.00–0.07)
Basophils Absolute: 0 10*3/uL (ref 0.0–0.1)
Basophils Relative: 1 %
Eosinophils Absolute: 0.2 10*3/uL (ref 0.0–0.5)
Eosinophils Relative: 3 %
HCT: 39.1 % (ref 39.0–52.0)
Hemoglobin: 12.5 g/dL — ABNORMAL LOW (ref 13.0–17.0)
Immature Granulocytes: 0 %
Lymphocytes Relative: 18 %
Lymphs Abs: 1.2 10*3/uL (ref 0.7–4.0)
MCH: 28.1 pg (ref 26.0–34.0)
MCHC: 32 g/dL (ref 30.0–36.0)
MCV: 87.9 fL (ref 80.0–100.0)
Monocytes Absolute: 0.6 10*3/uL (ref 0.1–1.0)
Monocytes Relative: 8 %
Neutro Abs: 5 10*3/uL (ref 1.7–7.7)
Neutrophils Relative %: 70 %
Platelets: 254 10*3/uL (ref 150–400)
RBC: 4.45 MIL/uL (ref 4.22–5.81)
RDW: 14.1 % (ref 11.5–15.5)
WBC: 7 10*3/uL (ref 4.0–10.5)
nRBC: 0 % (ref 0.0–0.2)

## 2023-09-06 LAB — COMPREHENSIVE METABOLIC PANEL
ALT: 25 U/L (ref 0–44)
AST: 17 U/L (ref 15–41)
Albumin: 3.6 g/dL (ref 3.5–5.0)
Alkaline Phosphatase: 71 U/L (ref 38–126)
Anion gap: 11 (ref 5–15)
BUN: 24 mg/dL — ABNORMAL HIGH (ref 6–20)
CO2: 28 mmol/L (ref 22–32)
Calcium: 9.7 mg/dL (ref 8.9–10.3)
Chloride: 103 mmol/L (ref 98–111)
Creatinine, Ser: 1.04 mg/dL (ref 0.61–1.24)
GFR, Estimated: >60 mL/min (ref >60)
Glucose, Bld: 97 mg/dL (ref 70–99)
Potassium: 3.9 mmol/L (ref 3.5–5.1)
Sodium: 142 mmol/L (ref 135–145)
Total Bilirubin: 0.5 mg/dL (ref 0.0–1.2)
Total Protein: 7.1 g/dL (ref 6.5–8.1)

## 2023-09-06 LAB — LIPID PANEL
Cholesterol: 173 mg/dL (ref 0–200)
HDL: 71 mg/dL (ref >40)
LDL Cholesterol: 92 mg/dL (ref 0–99)
Total CHOL/HDL Ratio: 2.4 {ratio}
Triglycerides: 49 mg/dL (ref <150)
VLDL: 10 mg/dL (ref 0–40)

## 2023-09-06 LAB — POCT URINE DRUG SCREEN - MANUAL ENTRY (I-SCREEN)
POC Amphetamine UR: NOT DETECTED
POC Buprenorphine (BUP): NOT DETECTED
POC Cocaine UR: POSITIVE — AB
POC Marijuana UR: POSITIVE — AB
POC Methadone UR: NOT DETECTED
POC Methamphetamine UR: NOT DETECTED
POC Morphine: NOT DETECTED
POC Oxazepam (BZO): NOT DETECTED
POC Oxycodone UR: NOT DETECTED
POC Secobarbital (BAR): NOT DETECTED

## 2023-09-06 LAB — HEMOGLOBIN A1C
Hgb A1c MFr Bld: 6.1 % — ABNORMAL HIGH (ref 4.8–5.6)
Mean Plasma Glucose: 128.37 mg/dL

## 2023-09-06 LAB — HEPATITIS PANEL, ACUTE
HCV Ab: NONREACTIVE
Hep A IgM: NONREACTIVE
Hep B C IgM: NONREACTIVE
Hepatitis B Surface Ag: NONREACTIVE

## 2023-09-06 LAB — ETHANOL: Alcohol, Ethyl (B): <10 mg/dL (ref <10)

## 2023-09-06 LAB — HIV ANTIBODY (ROUTINE TESTING W REFLEX): HIV Screen 4th Generation wRfx: NONREACTIVE

## 2023-09-06 LAB — TSH: TSH: 0.372 u[IU]/mL (ref 0.350–4.500)

## 2023-09-06 MED ORDER — LOPERAMIDE HCL 2 MG PO CAPS: 2.0000 mg | ORAL_CAPSULE | ORAL | Status: DC | PRN

## 2023-09-06 MED ORDER — HYDROXYZINE HCL 25 MG PO TABS: 25.0000 mg | ORAL_TABLET | Freq: Three times a day (TID) | ORAL | Status: DC | PRN

## 2023-09-06 MED ORDER — ONDANSETRON 4 MG PO TBDP: 4.0000 mg | ORAL_TABLET | Freq: Four times a day (QID) | ORAL | Status: DC | PRN

## 2023-09-06 MED ORDER — MAGNESIUM HYDROXIDE 400 MG/5ML PO SUSP: 30.0000 mL | Freq: Every day | ORAL | Status: DC | PRN

## 2023-09-06 MED ORDER — METHOCARBAMOL 500 MG PO TABS: 500.0000 mg | ORAL_TABLET | Freq: Three times a day (TID) | ORAL | Status: DC | PRN

## 2023-09-06 MED ORDER — NAPROXEN 500 MG PO TABS: 500.0000 mg | ORAL_TABLET | Freq: Two times a day (BID) | ORAL | Status: DC | PRN

## 2023-09-06 MED ORDER — TRAZODONE HCL 50 MG PO TABS: 50.0000 mg | ORAL_TABLET | Freq: Every evening | ORAL | Status: DC | PRN

## 2023-09-06 MED ORDER — DICYCLOMINE HCL 20 MG PO TABS: 20.0000 mg | ORAL_TABLET | Freq: Four times a day (QID) | ORAL | Status: DC | PRN

## 2023-09-06 MED ORDER — ACETAMINOPHEN 325 MG PO TABS: 650.0000 mg | ORAL_TABLET | Freq: Four times a day (QID) | ORAL | Status: DC | PRN

## 2023-09-06 MED ORDER — TRAZODONE HCL 50 MG PO TABS
50.0000 mg | ORAL_TABLET | Freq: Every evening | ORAL | Status: DC | PRN
  Administered 2023-09-06 – 2023-09-07 (×2): 50 mg via ORAL
  Filled 2023-09-06 (×2): qty 1

## 2023-09-06 MED ORDER — ALUM & MAG HYDROXIDE-SIMETH 200-200-20 MG/5ML PO SUSP: 30.0000 mL | ORAL | Status: DC | PRN

## 2023-09-06 MED ORDER — CLONIDINE HCL 0.1 MG PO TABS: 0.1000 mg | ORAL_TABLET | Freq: Every day | ORAL | Status: DC

## 2023-09-06 MED ORDER — CLONIDINE HCL 0.1 MG PO TABS
0.1000 mg | ORAL_TABLET | Freq: Four times a day (QID) | ORAL | Status: DC
  Administered 2023-09-06 – 2023-09-08 (×7): 0.1 mg via ORAL
  Filled 2023-09-06 (×8): qty 1

## 2023-09-06 MED ORDER — HYDROXYZINE HCL 25 MG PO TABS
25.0000 mg | ORAL_TABLET | Freq: Three times a day (TID) | ORAL | Status: DC | PRN
  Administered 2023-09-06 – 2023-09-08 (×3): 25 mg via ORAL
  Filled 2023-09-06 (×3): qty 1

## 2023-09-06 MED ORDER — CLONIDINE HCL 0.1 MG PO TABS: 0.1000 mg | ORAL_TABLET | ORAL | Status: DC

## 2023-09-06 NOTE — ED Notes (Signed)
Pt sleeping in no acute distress. RR even and unlabored. Environment secured. Will continue to monitor for safety.

## 2023-09-06 NOTE — ED Provider Notes (Signed)
Facility Based Crisis Admission H&P  Date: 09/06/23 Patient Name: Brett Gilbert MRN: [id number] Chief Complaint: Patient is a walk-in to the [facility] requesting detox and substance abuse treatment.  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  He presented to the [facility] behavioral health care voluntarily requesting substance abuse treatment.  He reports that he has been using cocaine intermittently since he was 38 years old but has recently escalated.  He has been abusing 1-2 times per week on average and claims that he prefers opioids to cocaine.  He has been using fentanyl since age 25.  He states that he has also been using heroin.  He denies alcohol use.  He states that he is requesting detox because he wants to be clean and sober and had recently lost his job on Thursday after he went into the bathroom and use drugs and was found asleep on the job.  He has been working for [employer]  He denies any current legal issues.  He denies any outpatient therapy. Patient also denied any depression or anxiety or psychosis.  He reports that he is not on any medications.  Mental status examination: When seen today the patient was alert oriented and cooperative.  He was somewhat sedated and tried to sleep most of the time.  He reports no acute withdrawal symptoms.  He maintained fair eye contact.  His speech was coherent without any obvious looseness of associations flight of ideas or tangentiality.  He states that he is currently not having any withdrawals but is anticipating going through withdrawals.  He would like to go to rehab after that.  He understands that he has lost a job because of his substance use.  He is hoping to get back to working after being clean and sober. PHQ 2-9:  Flowsheet Row ED from 08/06/2023 in [facility]  Thoughts that you would be better off dead, or of hurting yourself in some way Not at all  PHQ-9 Total Score 17       Flowsheet Row ED from 09/06/2023 in [facility] Most recent reading at 09/06/2023  1:16 PM ED from 09/06/2023 in [facility] Most recent reading at 09/06/2023  9:42 AM ED from 08/06/2023 in [facility] Most recent reading at 08/06/2023  1:50 PM  C-SSRS RISK CATEGORY No Risk No Risk No Risk       Screenings    Flowsheet Row Most Recent Value  COWS Total Score 0       Total Time spent with patient: 30 minutes  Musculoskeletal  Strength & Muscle Tone: within normal limits Gait & Station:  Patient was lying in bed. Patient leans:  Unable to test.  Psychiatric Specialty Exam  Presentation General Appearance:  Appropriate for Environment  Eye Contact: Fair  Speech: Clear and Coherent  Speech Volume: Normal  Handedness: Right   Mood and Affect  Mood: Dysphoric  Affect: Congruent   Thought Process  Thought Processes: Coherent  Descriptions of Associations:Intact  Orientation:Full (Time, Place and Person)  Thought Content:Logical  Diagnosis of Schizophrenia or Schizoaffective disorder in past: No   Hallucinations:Hallucinations: None  Ideas of Reference:None  Suicidal Thoughts:Suicidal Thoughts: No  Homicidal Thoughts:Homicidal Thoughts: No   Sensorium  Memory: Immediate Fair; Recent Fair; Remote Fair  Judgment: Fair  Insight: Fair   Chartered certified accountant: Fair  Attention Span: Fair  Recall: [employer] of Knowledge: Fair  Language: Fair   Psychomotor Activity  Psychomotor Activity: Psychomotor Activity: Normal   Assets  Assets: Manufacturing systems engineer; Desire for Improvement; Housing; Leisure Time; Physical Health   Sleep  Sleep: Sleep: Fair   Nutritional Assessment (For OBS and FBC  admissions only) Has the patient had a weight loss or gain of 10 pounds or more in the last 3 months?: No Has the patient had a decrease in food intake/or appetite?: No Does the patient have dental problems?: No Does the patient have eating habits or behaviors that may be indicators of an eating disorder including binging or inducing vomiting?: No Has the patient recently lost weight without trying?: 0 Has the patient been eating poorly because of a decreased appetite?: 0 Malnutrition Screening Tool Score: 0    Physical Exam ROS  Blood pressure 132/88, pulse 64, temperature 98.3 F (36.8 C), temperature source Oral, resp. rate 18, SpO2 100%. There is no height or weight on file to calculate BMI.  Past Psychiatric History: Patient gives a past history of substance abuse and [facility] 38 years [facility].  He also was admitted to [facility] on 08/06/2023 and 06/23/2022 and both times he was discharged AMA.  Patient does have a history of accidental drug overdose on 08/20/2023.  Is the patient at risk to self? Yes  Has the patient been a risk to self in the past 6 months? Yes .    Has the patient been a risk to self within the distant past? Yes   Is the patient a risk to others? No   Has the patient been a risk to others in the past 6 months? No   Has the patient been a risk to others within the distant past? No   Past Medical History: Patient denies any prior history of medical problems or surgeries. Family History: Unknown at this time. Social History: Patient is single and has no children and lives with his father.  He recently lost his job because of substance use disorder.  Last Labs:  Admission on 09/06/2023, Discharged on 09/06/2023  Component Date Value Ref Range Status   WBC 09/06/2023 7.0  4.0 - 10.5 K/uL Final   RBC 09/06/2023 4.45  4.22 - 5.81 MIL/uL Final   Hemoglobin 09/06/2023 12.5 (L)  13.0 - 17.0 g/dL Final   HCT 13/24/4010 39.1  39.0 - 52.0 % Final    MCV 09/06/2023 87.9  80.0 - 100.0 fL Final   MCH 09/06/2023 28.1  26.0 - 34.0 pg Final   MCHC 09/06/2023 32.0  30.0 - 36.0 g/dL Final   RDW 27/25/3664 14.1  11.5 - 15.5 % Final   Platelets 09/06/2023 254  150 - 400 K/uL Final   nRBC 09/06/2023 0.0  0.0 - 0.2 % Final   Neutrophils Relative % 09/06/2023 70  % Final   Neutro Abs 09/06/2023 5.0  1.7 - 7.7 K/uL Final   Lymphocytes Relative 09/06/2023 18  % Final   Lymphs Abs 09/06/2023 1.2  0.7 - 4.0 K/uL Final   Monocytes Relative 09/06/2023 8  % Final   Monocytes Absolute 09/06/2023 0.6  0.1 - 1.0 K/uL Final   Eosinophils Relative 09/06/2023 3  % Final   Eosinophils Absolute 09/06/2023 0.2  0.0 - 0.5 K/uL Final   Basophils Relative 09/06/2023 1  % Final   Basophils Absolute 09/06/2023 0.0  0.0 - 0.1 K/uL Final   Immature Granulocytes 09/06/2023 0  % Final   Abs Immature Granulocytes 09/06/2023 0.01  0.00 - 0.07 K/uL Final   Performed at [facility], [address] [place], [place] [address]   Sodium 09/06/2023 142  135 - 145 mmol/L Final   Potassium 09/06/2023 3.9  3.5 - 5.1 mmol/L Final   Chloride 09/06/2023 103  98 - 111 mmol/L Final   CO2 09/06/2023 28  22 - 32 mmol/L Final   Glucose, Bld 09/06/2023 97  70 - 99 mg/dL Final   Glucose reference range applies only to samples taken after fasting for at least 8 hours.   BUN 09/06/2023 24 (H)  6 - 20 mg/dL Final   Creatinine, Ser 09/06/2023 1.04  0.61 - 1.24 mg/dL Final   Calcium 60/45/4098 9.7  8.9 - 10.3 mg/dL Final   Total Protein 11/91/4782 7.1  6.5 - 8.1 g/dL Final   Albumin 95/62/1308 3.6  3.5 - 5.0 g/dL Final   AST 65/78/4696 17  15 - 41 U/L Final   ALT 09/06/2023 25  0 - 44 U/L Final   Alkaline Phosphatase 09/06/2023 71  38 - 126 U/L Final   Total Bilirubin 09/06/2023 0.5  0.0 - 1.2 mg/dL Final   GFR, Estimated 09/06/2023 >60  >60 mL/min Final   Comment: (NOTE) Calculated using the CKD-EPI Creatinine Equation (2021)    Anion gap 09/06/2023 11  5 - 15 Final   Performed  at [facility], [address] [place], [place] [address]   Hgb A1c MFr Bld 09/06/2023 6.1 (H)  4.8 - 5.6 % Final   Comment: (NOTE) Pre diabetes:          5.7%-6.4%  Diabetes:              >6.4%  Glycemic control for   <7.0% adults with diabetes    Mean Plasma Glucose 09/06/2023 128.37  mg/dL Final   Performed at [facility], [address] [place], [place] [address]   Alcohol, Ethyl (B) 09/06/2023 <10  <10 mg/dL Final   Comment: (NOTE) Lowest detectable limit for serum alcohol is 10 mg/dL.  For medical purposes only. Performed at [facility], [address] [place], [place] [address]    Cholesterol 09/06/2023 173  0 - 200 mg/dL Final   Triglycerides 72/53/6644 49  <150 mg/dL Final   HDL 03/47/4259 71  >40 mg/dL Final   Total CHOL/HDL Ratio 09/06/2023 2.4  RATIO Final   VLDL 09/06/2023 10  0 - 40 mg/dL Final   LDL Cholesterol 09/06/2023 92  0 - 99 mg/dL Final   Comment:        Total Cholesterol/HDL:CHD Risk Coronary Heart Disease Risk Table                     Men   Women  1/2 Average Risk   3.4   3.3  Average Risk       5.0   4.4  2 X Average Risk   9.6   7.1  3 X Average Risk  23.4   11.0        Use the calculated Patient Ratio above and the CHD Risk Table to determine the patient's CHD Risk.        ATP III CLASSIFICATION (LDL):  <100     mg/dL   Optimal  563-875  mg/dL   Near or Above  Optimal  130-159  mg/dL   Borderline  147-829  mg/dL   High  >562     mg/dL   Very High Performed at [facility], [address], [place], [place] [address]    TSH 09/06/2023 0.372  0.350 - 4.500 uIU/mL Final   Comment: Performed by a 3rd Generation assay with a functional sensitivity of <=0.01 uIU/mL. Performed at [facility], [address], [place], [place] [address]    Hepatitis B Surface Ag 09/06/2023 NON REACTIVE  NON REACTIVE Final   HCV Ab 09/06/2023 NON REACTIVE  NON REACTIVE Final   Comment: (NOTE) Nonreactive  HCV antibody screen is consistent with no HCV infections,  unless recent infection is suspected or other evidence exists to indicate HCV infection.     Hep A IgM 09/06/2023 NON REACTIVE  NON REACTIVE Final   Hep B C IgM 09/06/2023 NON REACTIVE  NON REACTIVE Final   Performed at [facility], [address], [place], [place] [address]   POC Amphetamine UR 09/06/2023 None Detected  NONE DETECTED (Cut Off Level 1000 ng/mL) Final   POC Secobarbital (BAR) 09/06/2023 None Detected  NONE DETECTED (Cut Off Level 300 ng/mL) Final   POC Buprenorphine (BUP) 09/06/2023 None Detected  NONE DETECTED (Cut Off Level 10 ng/mL) Final   POC Oxazepam (BZO) 09/06/2023 None Detected  NONE DETECTED (Cut Off Level 300 ng/mL) Final   POC Cocaine UR 09/06/2023 Positive (A)  NONE DETECTED (Cut Off Level 300 ng/mL) Final   POC Methamphetamine UR 09/06/2023 None Detected  NONE DETECTED (Cut Off Level 1000 ng/mL) Final   POC Morphine 09/06/2023 None Detected  NONE DETECTED (Cut Off Level 300 ng/mL) Final   POC Methadone UR 09/06/2023 None Detected  NONE DETECTED (Cut Off Level 300 ng/mL) Final   POC Oxycodone UR 09/06/2023 None Detected  NONE DETECTED (Cut Off Level 100 ng/mL) Final   POC Marijuana UR 09/06/2023 Positive (A)  NONE DETECTED (Cut Off Level 50 ng/mL) Final   HIV Screen 4th Generation wRfx 09/06/2023 Non Reactive  Non Reactive Final   Performed at [facility], [address], [place], [place] [address]  Admission on 08/20/2023, Discharged on 08/21/2023  Component Date Value Ref Range Status   Glucose-Capillary 08/20/2023 135 (H)  70 - 99 mg/dL Final   Glucose reference range applies only to samples taken after fasting for at least 8 hours.   WBC 08/20/2023 10.6 (H)  4.0 - 10.5 K/uL Final   RBC 08/20/2023 4.68  4.22 - 5.81 MIL/uL Final   Hemoglobin 08/20/2023 13.2  13.0 - 17.0 g/dL Final   HCT 24/40/1027 40.2  39.0 - 52.0 % Final   MCV 08/20/2023 85.9  80.0 - 100.0 fL Final   MCH 08/20/2023  28.2  26.0 - 34.0 pg Final   MCHC 08/20/2023 32.8  30.0 - 36.0 g/dL Final   RDW 25/36/6440 13.8  11.5 - 15.5 % Final   Platelets 08/20/2023 226  150 - 400 K/uL Final   nRBC 08/20/2023 0.0  0.0 - 0.2 % Final   Performed at [facility], [address] [place], [place] [address]   Sodium 08/20/2023 142  135 - 145 mmol/L Final   Potassium 08/20/2023 4.2  3.5 - 5.1 mmol/L Final   Chloride 08/20/2023 108  98 - 111 mmol/L Final   CO2 08/20/2023 24  22 - 32 mmol/L Final   Glucose, Bld 08/20/2023 130 (H)  70 - 99 mg/dL  Final   Glucose reference range applies only to samples taken after fasting for at least 8 hours.   BUN 08/20/2023 21 (H)  6 - 20 mg/dL Final   Creatinine, Ser 08/20/2023 1.15  0.61 - 1.24 mg/dL Final   Calcium 16/05/9603 9.6  8.9 - 10.3 mg/dL Final   GFR, Estimated 08/20/2023 >60  >60 mL/min Final   Comment: (NOTE) Calculated using the CKD-EPI Creatinine Equation (2021)    Anion gap 08/20/2023 10  5 - 15 Final   Performed at [facility], [address], [place], [place] [address]  Admission on 08/06/2023, Discharged on 08/07/2023  Component Date Value Ref Range Status   Free T4 08/06/2023 1.10  0.61 - 1.12 ng/dL Final   Comment: (NOTE) Biotin ingestion may interfere with free T4 tests. If the results are inconsistent with the TSH level, previous test results, or the clinical presentation, then consider biotin interference. If needed, order repeat testing after stopping biotin. Performed at [facility], [address] [place], [place] [address]    T3, Free 08/06/2023 3.3  2.0 - 4.4 pg/mL Final   Comment: (NOTE) Performed At: [facility] [address] [place], [place] [id number] Jolene Schimke MD YQ:[id number]   Admission on 08/06/2023, Discharged on 08/06/2023  Component Date Value Ref Range Status   WBC 08/06/2023 8.2  4.0 - 10.5 K/uL Final   RBC 08/06/2023 4.79  4.22 - 5.81 MIL/uL Final   Hemoglobin 08/06/2023 13.3  13.0 - 17.0 g/dL  Final   HCT 52/84/1324 41.6  39.0 - 52.0 % Final   MCV 08/06/2023 86.8  80.0 - 100.0 fL Final   MCH 08/06/2023 27.8  26.0 - 34.0 pg Final   MCHC 08/06/2023 32.0  30.0 - 36.0 g/dL Final   RDW 40/05/2724 13.5  11.5 - 15.5 % Final   Platelets 08/06/2023 320  150 - 400 K/uL Final   nRBC 08/06/2023 0.0  0.0 - 0.2 % Final   Neutrophils Relative % 08/06/2023 76  % Final   Neutro Abs 08/06/2023 6.2  1.7 - 7.7 K/uL Final   Lymphocytes Relative 08/06/2023 18  % Final   Lymphs Abs 08/06/2023 1.5  0.7 - 4.0 K/uL Final   Monocytes Relative 08/06/2023 4  % Final   Monocytes Absolute 08/06/2023 0.4  0.1 - 1.0 K/uL Final   Eosinophils Relative 08/06/2023 1  % Final   Eosinophils Absolute 08/06/2023 0.1  0.0 - 0.5 K/uL Final   Basophils Relative 08/06/2023 1  % Final   Basophils Absolute 08/06/2023 0.0  0.0 - 0.1 K/uL Final   Immature Granulocytes 08/06/2023 0  % Final   Abs Immature Granulocytes 08/06/2023 0.02  0.00 - 0.07 K/uL Final   Performed at [facility], [address], [place], [place] [address]   Sodium 08/06/2023 139  135 - 145 mmol/L Final   Potassium 08/06/2023 4.7  3.5 - 5.1 mmol/L Final   Chloride 08/06/2023 100  98 - 111 mmol/L Final   CO2 08/06/2023 29  22 - 32 mmol/L Final   Glucose, Bld 08/06/2023 82  70 - 99 mg/dL Final   Glucose reference range applies only to samples taken after fasting for at least 8 hours.   BUN 08/06/2023 15  6 - 20 mg/dL Final   Creatinine, Ser 08/06/2023 0.97  0.61 - 1.24 mg/dL Final   Calcium 03/47/4259 9.9  8.9 - 10.3 mg/dL Final   Total Protein 56/38/7564 7.5  6.5 - 8.1 g/dL Final  Albumin 08/06/2023 4.2  3.5 - 5.0 g/dL Final   AST 16/05/9603 34  15 - 41 U/L Final   ALT 08/06/2023 40  0 - 44 U/L Final   Alkaline Phosphatase 08/06/2023 81  38 - 126 U/L Final   Total Bilirubin 08/06/2023 1.0  0.0 - 1.2 mg/dL Final   GFR, Estimated 08/06/2023 >60  >60 mL/min Final   Comment: (NOTE) Calculated using the CKD-EPI Creatinine Equation (2021)     Anion gap 08/06/2023 10  5 - 15 Final   Performed at [facility], [address], [place], [place] [address]   Hgb A1c MFr Bld 08/06/2023 6.1 (H)  4.8 - 5.6 % Final   Comment: (NOTE)         Prediabetes: 5.7 - 6.4         Diabetes: >6.4         Glycemic control for adults with diabetes: <7.0    Mean Plasma Glucose 08/06/2023 128  mg/dL Final   Comment: (NOTE) Performed At: [facility] [address] [place], [place] [id number] Jolene Schimke MD FA:[id number]    Magnesium 08/06/2023 2.3  1.7 - 2.4 mg/dL Final   Performed at [facility], [address], [place], [place] [address]   Alcohol, Ethyl (B) 08/06/2023 <10  <10 mg/dL Final   Comment: (NOTE) Lowest detectable limit for serum alcohol is 10 mg/dL.  For medical purposes only. Performed at [facility], [address] [place], [place] [address]    Cholesterol 08/06/2023 171  0 - 200 mg/dL Final   Triglycerides 32/44/0102 51  <150 mg/dL Final   HDL 72/53/6644 64  >40 mg/dL Final   Total CHOL/HDL Ratio 08/06/2023 2.7  RATIO Final   VLDL 08/06/2023 10  0 - 40 mg/dL Final   LDL Cholesterol 08/06/2023 97  0 - 99 mg/dL Final   Comment:        Total Cholesterol/HDL:CHD Risk Coronary Heart Disease Risk Table                     Men   Women  1/2 Average Risk   3.4   3.3  Average Risk       5.0   4.4  2 X Average Risk   9.6   7.1  3 X Average Risk  23.4   11.0        Use the calculated Patient Ratio above and the CHD Risk Table to determine the patient's CHD Risk.        ATP III CLASSIFICATION (LDL):  <100     mg/dL   Optimal  034-742  mg/dL   Near or Above                    Optimal  130-159  mg/dL   Borderline  595-638  mg/dL   High  >756     mg/dL   Very High Performed at [facility], [address], [place], [place] [address]    TSH 08/06/2023 0.298 (L)  0.350 - 4.500 uIU/mL Final   Comment: Performed by a 3rd Generation assay with a functional sensitivity of <=0.01  uIU/mL. Performed at [facility], [address], [place], [place] [address]    POC Amphetamine UR 08/06/2023 None Detected  NONE DETECTED (Cut Off Level 1000 ng/mL) Final   POC Secobarbital (BAR) 08/06/2023 None Detected  NONE DETECTED (Cut Off Level 300 ng/mL) Final  POC Buprenorphine (BUP) 08/06/2023 Positive (A)  NONE DETECTED (Cut Off Level 10 ng/mL) Final   POC Oxazepam (BZO) 08/06/2023 None Detected  NONE DETECTED (Cut Off Level 300 ng/mL) Final   POC Cocaine UR 08/06/2023 Positive (A)  NONE DETECTED (Cut Off Level 300 ng/mL) Final   POC Methamphetamine UR 08/06/2023 None Detected  NONE DETECTED (Cut Off Level 1000 ng/mL) Final   POC Morphine 08/06/2023 None Detected  NONE DETECTED (Cut Off Level 300 ng/mL) Final   POC Methadone UR 08/06/2023 None Detected  NONE DETECTED (Cut Off Level 300 ng/mL) Final   POC Oxycodone UR 08/06/2023 None Detected  NONE DETECTED (Cut Off Level 100 ng/mL) Final   POC Marijuana UR 08/06/2023 Positive (A)  NONE DETECTED (Cut Off Level 50 ng/mL) Final    Allergies: Seasonal ic [octacosanol]  Medications:  Facility Ordered Medications  Medication   acetaminophen (TYLENOL) tablet 650 mg   alum & mag hydroxide-simeth (MAALOX/MYLANTA) 200-200-20 MG/5ML suspension 30 mL   magnesium hydroxide (MILK OF MAGNESIA) suspension 30 mL   hydrOXYzine (ATARAX) tablet 25 mg   traZODone (DESYREL) tablet 50 mg   dicyclomine (BENTYL) tablet 20 mg   loperamide (IMODIUM) capsule 2-4 mg   methocarbamol (ROBAXIN) tablet 500 mg   naproxen (NAPROSYN) tablet 500 mg   ondansetron (ZOFRAN-ODT) disintegrating tablet 4 mg   cloNIDine (CATAPRES) tablet 0.1 mg   Followed by   Melene Muller ON 09/08/2023] cloNIDine (CATAPRES) tablet 0.1 mg   Followed by   Melene Muller ON 09/11/2023] cloNIDine (CATAPRES) tablet 0.1 mg    Long Term Goals: Improvement in symptoms so as ready for discharge  Short Term Goals: Patient will verbalize feelings in meetings with treatment team members., Patient  will attend at least of 50% of the groups daily., Pt will complete the PHQ9 on admission, day 3 and discharge., Patient will participate in completing the [place] Suicide Severity Rating Scale, Patient will score a low risk of violence for 24 hours prior to discharge, and Patient will take medications as prescribed daily.  Medical Decision Making  The patient meets the criteria for admission to [facility] for observation in case he goes through withdrawals, stabilization and referral to [facility] or [facility]hab.  However prognosis is guarded given the fact that on 2 previous admissions patient left AGAINST MEDICAL ADVICE.    Recommendations  Based on my evaluation the patient does not appear to have an emergency medical condition.  Rex Kras, MD 09/06/23  2:31 PM

## 2023-09-06 NOTE — Discharge Instructions (Signed)
Transfer to [facility]

## 2023-09-06 NOTE — Progress Notes (Signed)
   09/06/23 0928  [facility] Triage Screening (Walk-ins at [facility] only)  How Did You Hear About [place]? Self  What Is the Reason for Your Visit/Call Today? Brett Gilbert presents to [facility] voluntarily unaccompanied.Pt states that he just lost his job due to his addiction to heroin and Fentanyl. Pt shares that he used about a half gram of fentanyl last night. Pt denies SI, HI, AVH and alcohol use at this present time.  How Long Has This Been Causing You Problems? > than 6 months  Have You Recently Had Any Thoughts About Hurting Yourself? No  Are You Planning to Commit Suicide/Harm Yourself At This time? No  Have you Recently Had Thoughts About Hurting Someone [name]? No  Are You Planning To Harm Someone At This Time? No  Physical Abuse Denies  Verbal Abuse Denies  Sexual Abuse Denies  Exploitation of patient/patient's resources Denies  Self-Neglect Denies  Are you currently experiencing any auditory, visual or other hallucinations? No  Have You Used Any Alcohol or Drugs in the Past 24 Hours? Yes  How long ago did you use Drugs or Alcohol? last night - fentanyl  What Did You Use and How Much? about a half gram  Do you have any current medical co-morbidities that require immediate attention? No  Clinician description of patient physical appearance/behavior: calm, cooperative  What Do You Feel Would Help You the Most Today? Social Support;Alcohol or Drug Use Treatment  If access to [facility] was not available, would you have sought care in the Emergency Department? No  Determination of Need Routine (7 days)  Options For Referral Medication Management;Facility-Based Crisis;Outpatient Therapy

## 2023-09-06 NOTE — ED Notes (Signed)
Patient is sleeping. Respirations equal and unlabored, skin warm and dry. No change in assessment or acuity. Routine safety checks conducted according to facility protocol. Will continue to monitor for safety.

## 2023-09-06 NOTE — Group Note (Signed)
Group Topic: Social Support  Group Date: 09/06/2023 Start Time: 2000 End Time: 2030 Facilitators: Rae Lips B  Department: [facility]  Number of Participants: 3  Group Focus: activities of daily living skills Treatment Modality:  Family Therapy Interventions utilized were leisure development Purpose: express feelings  Name: Brett Gilbert Date of Birth: 02/17/1986  MR: [id number]    Level of Participation: PT DIDN'T ATTEND GROUP. Quality of Participation: cooperative Interactions with others: gave feedback Mood/Affect: appropriate Triggers (if applicable): N/A Cognition: coherent/clear Progress: Gaining insight Response: N/A Plan: patient will be encouraged to keep attending group.  Patients Problems:  Patient Active Problem List   Diagnosis Date Noted   Substance use disorder 09/06/2023   Opioid use disorder 06/23/2022

## 2023-09-06 NOTE — ED Provider Notes (Addendum)
Behavioral Health Urgent Care Medical Screening Exam  Patient Name: Brett Gilbert MRN: [id number] Date of Evaluation: 09/06/23 Chief Complaint:   Diagnosis:  Final diagnoses:  Substance use disorder  Opioid use disorder  Cocaine abuse (HCC)    History of Present illness: Brett Gilbert is a 38 y.o. male patient with a past psychiatric history significant for substance use disorder (fentanyl, heroin, and cocaine use) who presented to the [facility] behavioral health care voluntary requesting substance abuse treatment.  Patient reports using cocaine intermittently since he was 38 years old and states that he has been using cocaine on average, 1-2 times per week, on average she spends $80 per week on cocaine, method of use is snorting and states that he last used cocaine the day before yesterday.  He reports using fentanyl since age 50 years old, and states that he uses fentanyl daily, on average he uses $60/half a gram per day, method of use by snorting and states that he last used fentanyl last night, less than a half a gram. He denies active withdrawal.  He reports using heroin when he was 38 years old and states that he last knowingly used heroin when he was 38 years old. He states, "it's hard to know the difference if using fentanyl or heroin."   Patient denies drinking alcohol.   Patient reports past substance abuse treatment at [facility] when he was 38 years old. Per chart review, patient was admitted to the facility based crisis unit on 08/06/2023 and 06/23/2022 and both times requested to be discharged AMA. Per chart review, patient also had an accidental drug overdose on opioids on 08/20/2023.  Patient denies SI/HI/AVH. There is no objective evidence that the patient is currently responding to internal or external stimuli. Patient denies mood symptoms or anxiety symptoms. He states that he does not like the way he is living his life is right now and that he is trying to get  help. He states that he does not feel depressed. He states that when he doesn't use drugs he can't sleep and when he uses drugs he gets a full night of sleep. He states that when he uses drugs he has an appetite and when he doesn't use drugs he doesn't have an appetite. He states that he is looking for detox and residential treatment programs because he needs to get out of his current environment.  Patient resides with his father. He is currently unemployed and states that he lost his job on Thursday after he went in the bathroom and used drugs and was found asleep. 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 He denies legal issues. He denies outpatient psychiatry or therapy. He denies a past psychiatric history, no anxiety, depression, bipolar, or schizophrenia. He reports a family psychiatric history of father's side of the family are drinkers and father diagnosed with schizophrenia.    Flowsheet Row ED from 09/06/2023 in [facility] Most recent reading at 09/06/2023  9:42 AM ED from 08/06/2023 in [facility] Most recent reading at 08/06/2023  1:50 PM ED from 08/06/2023 in [facility] Most recent reading at 08/06/2023  9:21 AM  C-SSRS RISK CATEGORY No Risk No Risk No Risk       Psychiatric Specialty Exam  Presentation  General Appearance:Appropriate for Environment  Eye Contact:Fair  Speech:Clear and Coherent  Speech Volume:Normal  Handedness:Right   Mood and Affect  Mood: Dysphoric  Affect: Congruent   Thought Process  Thought Processes: Coherent  Descriptions of Associations:Intact  Orientation:Full (Time, Place and Person)  Thought Content:Logical  Diagnosis of Schizophrenia or Schizoaffective disorder in past: No   Hallucinations:None  Ideas of Reference:None  Suicidal Thoughts:No  Homicidal Thoughts:No   Sensorium  Memory: Immediate Fair; Recent  Fair; Remote Fair  Judgment: Fair  Insight: Fair   Art therapist  Concentration: Fair  Attention Span: Fair  Recall: [employer] of Knowledge: Fair  Language: Fair   Psychomotor Activity  Psychomotor Activity: Normal   Assets  Assets: Manufacturing systems engineer; Desire for Improvement; Housing; Leisure Time; Physical Health   Sleep  Sleep: Fair  N  Physical Exam: Physical Exam Cardiovascular:     Rate and Rhythm: Normal rate.  Pulmonary:     Effort: Pulmonary effort is normal.  Musculoskeletal:        General: Normal range of motion.     Cervical back: Normal range of motion.  Neurological:     Mental Status: He is alert and oriented to person, place, and time.    Review of Systems  Constitutional: Negative.   HENT: Negative.    Eyes: Negative.   Respiratory: Negative.    Cardiovascular: Negative.   Gastrointestinal: Negative.   Genitourinary: Negative.   Musculoskeletal: Negative.   Neurological: Negative.   Endo/Heme/Allergies: Negative.   Psychiatric/Behavioral:  Positive for substance abuse.    Blood pressure 132/88, pulse 64, temperature 98.3 F (36.8 C), temperature source Oral, resp. rate 17, SpO2 100%. There is no height or weight on file to calculate BMI.  Musculoskeletal: Strength & Muscle Tone: within normal limits Gait & Station: normal Patient leans: N/A   [facility] MSE Discharge Disposition for Follow up and Recommendations: Based on my evaluation I certify that psychiatric inpatient services furnished can reasonably be expected to improve the patient's condition which I recommend transfer to an appropriate accepting facility.   Patient to be admitted to the [facility] Based Crisis for substance use disorder to detox from opioids. Patient is interested in residential substance abuse treatment after competing opioid detox.   Lab Orders         CBC with Differential/Platelet         Comprehensive  metabolic panel         Hemoglobin A1c         Ethanol         Lipid panel         TSH         Hepatitis panel, acute         RPR         HIV Antibody (routine testing w rflx)         POCT Urine Drug Screen - (I-Screen)    EKG  Admission orders placed for [facility].    Jyssica Rief L, NP 09/06/2023, 10:50 AM

## 2023-09-06 NOTE — ED Notes (Signed)
Pt admitted to [facility] requesting detox from opiates. Pt reports using Fentanyl and Heroin with last use yesterday. Pt states, "I've got to stop before I kill myself". Pt reports wanting residential tx after detox. Calm, cooperative throughout interview process. Pt denies SI/HI/AVH. Denies withdrawal sx for opioids. Skin assessment completed. Oriented to unit. Meal and drink offered. Pt verbally contract for safety. Informed pt to notify staff with any needs or concerns. Pt verbalized understanding and agreement. Will monitor for safety.

## 2023-09-06 NOTE — ED Notes (Signed)
Writer informed provider of flagged BP reading of 145/82. Orders given to continue to monitor. Pt denies concerns. Will continue to monitor and report abnormal reading to oncoming shift.

## 2023-09-06 NOTE — ED Notes (Signed)
Pt is in the dayroom watching TV with peers. Pt denies SI/HI/AVH. Pt has no further complain.No acute distress noted. Will continue to monitor for safety and provide support.

## 2023-09-06 NOTE — Group Note (Signed)
Group Topic: Balance in Life  Group Date: 09/06/2023 Start Time: 1131 End Time: 1152 Facilitators: Vonzell Schlatter B  Department: [facility]  Number of Participants: 3  Group Focus: activities of daily living skills and relapse prevention Treatment Modality:  Psychoeducation Interventions utilized were patient education and reality testing Purpose: increase insight and reinforce self-care  Name: Brett Gilbert Date of Birth: 1986-01-18  MR: [id number]    Level of Participation: Did not attend pt was not on the unit yet Quality of Participation:  Interactions with others:  Mood/Affect:  Triggers (if applicable):  Cognition:  Progress:  Response:  Plan: follow-up needed  Patients Problems:  Patient Active Problem List   Diagnosis Date Noted   Substance use disorder 09/06/2023   Opioid use disorder 06/23/2022

## 2023-09-06 NOTE — Group Note (Signed)
Group Topic: Decisional Balance/Substance Abuse  Group Date: 09/06/2023 Start Time: 1715 End Time: 1740 Facilitators: Prentice Docker, RN  Department: [facility]  Number of Participants: 7  Group Focus: coping skills Treatment Modality:  Leisure Development Interventions utilized were leisure development Purpose: enhance coping skills  Name: Brett Gilbert Date of Birth: 09-Feb-1986  MR: [id number]    Level of Participation: active Quality of Participation: attentive and cooperative Interactions with others: gave feedback Mood/Affect: appropriate Triggers (if applicable): "I don't need a trigger. It just happens" Cognition: coherent/clear Progress: Significant Response: "I have to learn to love myself but my first step was to come here and admit I can't do it by myself" Plan: patient will be encouraged to communicate when he is having cravings and to seek a sponsor  Patients Problems:  Patient Active Problem List   Diagnosis Date Noted   Substance use disorder 09/06/2023   Opioid use disorder 06/23/2022

## 2023-09-06 NOTE — BH Assessment (Addendum)
Comprehensive Clinical Assessment (CCA) Note  09/06/2023 Brett Gilbert [id number]  Disposition: Per Liborio Nixon, NP admission to [facility] is recommended.    The patient demonstrates the following risk factors for suicide: Chronic risk factors for suicide include: substance use disorder. Acute risk factors for suicide include: family or marital conflict, social withdrawal/isolation, and loss (financial, interpersonal, professional). Protective factors for this patient include: positive social support and hope for the future. Considering these factors, the overall suicide risk at this point appears to be low. Patient is appropriate for outpatient follow up, once stabilized.   Patient is a 38 year old male with a history of Opioid Use Disorder, severe and Cocaine Use Disorder, moderate who presents voluntarily to [facility] seeking detox treatment.  Patient states that he just lost his job due to his addiction to heroin and fentanyl. 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  He was let go shortly after this episode and he is currently unemployed.  Patient was recently admitted to the [facility] program on 08/06/23 and he left AMA the next day, at the time he was offered residential treatment with [facility].  Patient states he was not ready for treatment at that time, however now he feels he is.  He has been using 1/2 gram of fentanyl (possibly heroin mix) daily for the past few weeks.  He reports hx of using on and off for 6 yrs. He last used 1/2 g of fentanyl last night.   Patient admits has also been using cocaine 1-2 x weekly for this 6 year period, last use was 2 days ago.  Patient denies SI, HI and AVH.  He is agreeable with recommendation for admission to [facility].  Patient also expresses interest in 28 day residential treatment once he completes detox.   Chief Complaint:  Chief  Complaint  Patient presents with   Addiction Problem   Visit Diagnosis: Opioid Use Disorder, severe                             Cocaine Use Disorder, moderate    CCA Screening, Triage and Referral (STR)  Patient Reported Information How did you hear about [place]? Self  What Is the Reason for Your Visit/Call Today? Brett Gilbert presents to [facility] voluntarily unaccompanied.Pt states that he just lost his job due to his addiction to heroin and Fentanyl. Pt shares that he used about a half gram of fentanyl last night. Pt denies SI, HI, AVH and alcohol use at this present time.  How Long Has This Been Causing You Problems? > than 6 months  What Do You Feel Would Help You the Most Today? Social Support; Alcohol or Drug Use Treatment   Have You Recently Had Any Thoughts About Hurting Yourself? No  Are You Planning to Commit Suicide/Harm Yourself At This time? No   Flowsheet Row ED from 09/06/2023 in [facility] Most recent reading at 09/06/2023  9:42 AM ED from 08/06/2023 in [facility] Most recent reading at 08/06/2023  1:50 PM ED from 08/06/2023 in [facility] Most recent reading at 08/06/2023  9:21 AM  C-SSRS RISK CATEGORY No Risk No Risk No Risk       Have you Recently Had Thoughts About Hurting Someone Brett Gilbert? No  Are You Planning to Harm Someone at  This Time? No  Explanation: N/A   Have You Used Any Alcohol or Drugs in the Past 24 Hours? Yes  How Long Ago Did You Use Drugs or Alcohol? yesterday What Did You Use and How Much? about a half gram   Do You Currently Have a Therapist/Psychiatrist? No  Name of Therapist/Psychiatrist: Name of Therapist/Psychiatrist: None   Have You Been Recently Discharged From Any Office Practice or Programs? No  Explanation of Discharge From Practice/Program: N/A     CCA Screening Triage Referral Assessment Type of Contact: Face-to-Face  Telemedicine  Service Delivery:   Is this Initial or Reassessment?   Date Telepsych consult ordered in [facility]:    Time Telepsych consult ordered in [facility]:    Location of Assessment: [facility] Assessment Services  Provider Location: [facility] Assessment Services   Collateral Involvement: None   Does Patient Have a Automotive engineer Guardian? No  Legal Guardian Contact Information: N/A  Copy of Legal Guardianship Form: -- (N/A)  Legal Guardian Notified of Arrival: -- (N/A)  Legal Guardian Notified of Pending Discharge: -- (N/A)  If Minor and Not Living with Parent(s), Who has Custody? n/a  Is CPS involved or ever been involved? Never  Is APS involved or ever been involved? Never   Patient Determined To Be At Risk for Harm To Self or Others Based on Review of Patient Reported Information or Presenting Complaint? No  Method: -- (N/A, no HI)  Availability of Means: -- (N/A, no HI)  Intent: -- (N/A, no HI)  Notification Required: -- (N/A, no HI)  Additional Information for Danger to Others Potential: -- (N/A, no HI)  Additional Comments for Danger to Others Potential: N/A, no HI  Are There Guns or Other Weapons in Your Home? No  Types of Guns/Weapons: N/A  Are These Weapons Safely Secured?                            -- (N/A)  Who Could Verify You Are Able To Have These Secured: N/A  Do You Have any Outstanding Charges, Pending Court Dates, Parole/Probation? None  Contacted To Inform of Risk of Harm To Self or Others: -- (N/A, no HI)    Does Patient Present under Involuntary Commitment? No    [place] of Residence: [place]   Patient Currently Receiving the Following Services: Not Receiving Services   Determination of Need: Urgent (48 hours)   Options For Referral: Medication Management; Facility-Based Crisis; Outpatient Therapy     CCA Biopsychosocial Patient Reported Schizophrenia/Schizoaffective Diagnosis in Past: No   Strengths: Patient is seeking treatment,  reports some family support.   Mental Health Symptoms Depression:  Change in energy/activity   Duration of Depressive symptoms: Duration of Depressive Symptoms: Greater than two weeks   Mania:  None   Anxiety:   Worrying; Tension   Psychosis:  None   Duration of Psychotic symptoms:    Trauma:  None   Obsessions:  None   Compulsions:  None   Inattention:  N/A   Hyperactivity/Impulsivity:  N/A   Oppositional/Defiant Behaviors:  N/A   Emotional Irregularity:  None   Other Mood/Personality Symptoms:  NA    Mental Status Exam Appearance and self-care  Stature:  Tall   Weight:  Average weight   Clothing:  Age-appropriate   Grooming:  Normal   Cosmetic use:  None   Posture/gait:  Normal   Motor activity:  Not Remarkable   Sensorium  Attention:  Normal   Concentration:  Normal   Orientation:  X5   Recall/memory:  Normal   Affect and Mood  Affect:  Appropriate   Mood:  Depressed   Relating  Eye contact:  Normal   Facial expression:  Depressed   Attitude toward examiner:  Cooperative   Thought and Language  Speech flow: Clear and Coherent   Thought content:  Appropriate to Mood and Circumstances   Preoccupation:  None   Hallucinations:  None   Organization:  Intact   Company secretary of Knowledge:  Average   Intelligence:  Average   Abstraction:  Normal   Judgement:  Fair   Dance movement psychotherapist:  Adequate   Insight:  Fair   Decision Making:  Normal   Social Functioning  Social Maturity:  Responsible   Social Judgement:  Normal   Stress  Stressors:  Family conflict; Housing; Surveyor, quantity   Coping Ability:  Exhausted   Skill Deficits:  None   Supports:  Support needed     Religion: Religion/Spirituality Are You A Religious Person?: No How Might This Affect Treatment?: NA  Leisure/Recreation: Leisure / Recreation Do You Have Hobbies?: No  Exercise/Diet: Exercise/Diet Do You Exercise?: No Have You Gained or  Lost A Significant Amount of Weight in the Past Six Months?: No Do You Follow a Special Diet?: No Do You Have Any Trouble Sleeping?: Yes Explanation of Sleeping Difficulties: Patient has difficulty sleeping when he is not using substances.   CCA Employment/Education Employment/Work Situation: Employment / Work Situation Employment Situation: Employed Work Stressors: none Patient's Job has Been Impacted by Current Illness: No Has Patient ever Been in Equities trader?: No  Education: Education Is Patient Currently Attending School?: No Last Grade Completed: 12 (12th grade) Did You Attend College?: No Did You Have An Individualized Education Program (IIEP): No Did You Have Any Difficulty At School?: No Patient's Education Has Been Impacted by Current Illness: No   CCA Family/Childhood History Family and Relationship History: Family history Marital status: Single Does patient have children?: No  Childhood History:  Childhood History By whom was/is the patient raised?: Father Did patient suffer any verbal/emotional/physical/sexual abuse as a child?: No Did patient suffer from severe childhood neglect?: No Has patient ever been sexually abused/assaulted/raped as an adolescent or adult?: No Was the patient ever a victim of a crime or a disaster?: No Witnessed domestic violence?: No Has patient been affected by domestic violence as an adult?: No       CCA Substance Use Alcohol/Drug Use: Alcohol / Drug Use Pain Medications: See MAR Prescriptions: See MAR Over the Counter: See MAR History of alcohol / drug use?: Yes Longest period of sobriety (when/how long): Pt reports he was sober two weeks after leaving treatment last year Negative Consequences of Use: Personal relationships, Financial Withdrawal Symptoms: None Substance #1 Name of Substance 1: Heroin, likely mixed with fentanyl 1 - Age of First Use: 31 1 - Amount (size/oz): 1/2 gram ($60) 1 - Frequency: daily 1 -  Duration: on and off 6 yrs 1 - Last Use / Amount: last night, 1/2 gram 1 - Method of Aquiring: NA 1- Route of Use: snorts Substance #2 Name of Substance 2: Cocaine 2 - Age of First Use: 38 years old 2 - Amount (size/oz): $20 to $30 worth per use 2 - Frequency: 1-2 x per week 2 - Duration: on-going 2 - Last Use / Amount: 2 days ago - amt unknown 2 - Method of Aquiring: varies 2 - Route  of Substance Use: snorts                     ASAM's:  Six Dimensions of Multidimensional Assessment  Dimension 1:  Acute Intoxication and/or Withdrawal Potential:   Dimension 1:  Description of individual's past and current experiences of substance use and withdrawal: No current s/s of intoxication or w/d - recent [facility] admission, left AMA  Dimension 2:  Biomedical Conditions and Complications:   Dimension 2:  Description of patient's biomedical conditions and  complications: Able to cope with physical discomfort/pain  Dimension 3:  Emotional, Behavioral, or Cognitive Conditions and Complications:  Dimension 3:  Description of emotional, behavioral, or cognitive conditions and complications: underlying, untreated depression  Dimension 4:  Readiness to Change:  Dimension 4:  Description of Readiness to Change criteria: seeking treatment, stating he is "ready" this time  Dimension 5:  Relapse, Continued use, or Continued Problem Potential:  Dimension 5:  Relapse, continued use, or continued problem potential critiera description: limited awareness of MI and SA related issues  Dimension 6:  Recovery/Living Environment:  Dimension 6:  Recovery/Iiving environment criteria description: Patient lives father, stating he lives in an area where he is surrounded by drug use.  ASAM Severity Score: ASAM's Severity Rating Score: 8  ASAM Recommended Level of Treatment: ASAM Recommended Level of Treatment: Level III Residential Treatment   Substance use Disorder (SUD) Substance Use Disorder (SUD)   Checklist Symptoms of Substance Use: Continued use despite persistent or recurrent social, interpersonal problems, caused or exacerbated by use, Recurrent use that results in a failure to fulfill major role obligations (work, school, home), Persistent desire or unsuccessful efforts to cut down or control use  Recommendations for Services/Supports/Treatments: Recommendations for Services/Supports/Treatments Recommendations For Services/Supports/Treatments: Detox, Individual Therapy, Medication Management, Facility Based Crisis  Disposition Recommendation per psychiatric provider: We recommend transfer to [facility].for [facility] admission.   DSM5 Diagnoses: Patient Active Problem List   Diagnosis Date Noted   Opioid use disorder 06/23/2022     Referrals to Alternative Service(s): Referred to Alternative Service(s):   Place:   Date:   Time:    Referred to Alternative Service(s):   Place:   Date:   Time:    Referred to Alternative Service(s):   Place:   Date:   Time:    Referred to Alternative Service(s):   Place:   Date:   Time:     Yetta Glassman, [facility]

## 2023-09-07 DIAGNOSIS — F1120 Opioid dependence, uncomplicated: Secondary | ICD-10-CM | POA: Diagnosis not present

## 2023-09-07 LAB — GC/CHLAMYDIA PROBE AMP (~~LOC~~) NOT AT ARMC
Chlamydia: NEGATIVE
Comment: NEGATIVE
Comment: NORMAL
Neisseria Gonorrhea: NEGATIVE

## 2023-09-07 LAB — RPR: RPR Ser Ql: NONREACTIVE

## 2023-09-07 NOTE — Group Note (Signed)
Group Topic: Relapse and Recovery  Group Date: 09/07/2023 Start Time: 1000 End Time: 1030 Facilitators: Jenean Lindau, RN  Department: [facility]  Number of Participants: 6  Group Focus: chemical dependency education, chemical dependency issues, and clarity of thought Treatment Modality:  Behavior Modification Therapy Interventions utilized were confrontation and exploration Purpose: increase insight and relapse prevention strategies  Name: SHANAN MCMILLER Date of Birth: [birth year]  MR: [id number]    Level of Participation: minimal Quality of Participation: attentive and cooperative Interactions with others: gave feedback Mood/Affect: appropriate Triggers (if applicable):   Cognition: coherent/clear and goal directed Progress: Minimal Response:   Plan: follow-up needed  Patients Problems:  Patient Active Problem List   Diagnosis Date Noted   Substance use disorder 09/06/2023   Opioid use disorder 06/23/2022

## 2023-09-07 NOTE — ED Notes (Signed)
Pt a/o . Watching tv with peers. Denies SI/ HI/AVH. Denies s/s of withdrawal. No noted distress. Will continue to monitor for safety.

## 2023-09-07 NOTE — Tx Team (Signed)
Patient is known to this provider. Per chart, "Brett Gilbert presents to [facility] voluntarily unaccompanied. Pt states that he just lost his job due to his addiction to heroin and Fentanyl. Pt shares that he used about a half gram of fentanyl last night. Pt denies SI, HI, AVH and alcohol use at this present time. Patient resides with his father. He is currently unemployed and states that he lost his job on Thursday after he went in the bathroom and used drugs and was found asleep. 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 He denies legal issues. He denies outpatient psychiatry or therapy. He denies a past psychiatric history, no anxiety, depression, bipolar, or schizophrenia. He reports a family psychiatric history of father's side of the family are drinkers and father diagnosed with schizophrenia. Patient also expresses interest in 28 day residential treatment once he completes detox".   Referrals have been sent to the following facilities for review: [facility] [place]  LCSW will provide updates once received.   Fernande Boyden, LCSW Clinical Social Worker [facility] Ph: [phone]

## 2023-09-07 NOTE — [facility] Group Notes (Signed)
SPIRITUALITY GROUP NOTE  Spirituality group facilitated by Wilkie Aye, MDiv, [facility].  Group Description: Group focused on topic of hope. Patients participated in facilitated discussion around topic, connecting with one another around experiences and definitions for hope. Group members engaged with visual explorer photos, reflecting on what hope looks like for them today. Group engaged in discussion around how their definitions of hope are present today in hospital.  Modalities: Psycho-social ed, Adlerian, Narrative, MI  Patient Progress: Present throughout group.  Engaged when prompted by facilitator or other group members.  Noted he has burned bridges in [place], so hope looks like relocation and change.  Through group named difficulty in connecting to vision of how life can be different, because he described no experience of life outside of using.

## 2023-09-07 NOTE — Group Note (Signed)
Group Topic: Change and Accountability  Group Date: 09/07/2023 Start Time: 1210 End Time: 1244 Facilitators: Jenean Lindau, RN  Department: [facility]  Number of Participants: 5  Group Focus: chemical dependency education, chemical dependency issues, clarity of thought, co-dependency, communication, and coping skills Treatment Modality:  Behavior Modification Therapy Interventions utilized were clarification, exploration, and patient education Purpose: enhance coping skills, explore maladaptive thinking, express feelings, express irrational fears, improve communication skills, increase insight, regain self-worth, reinforce self-care, and relapse prevention strategies  Name: Brett Gilbert Date of Birth: [birth year]  MR: [id number]    Level of Participation: moderate Quality of Participation: attentive and cooperative Interactions with others: gave feedback Mood/Affect: appropriate Triggers (if applicable):   Cognition: goal directed Progress: Moderate Response:   Plan: follow-up needed  Patients Problems:  Patient Active Problem List   Diagnosis Date Noted   Substance use disorder 09/06/2023   Opioid use disorder 06/23/2022

## 2023-09-07 NOTE — ED Notes (Signed)
Patient awake and sitting in dayroom with peers watching tv and eating breakfast.  No distress or withdrawal at this time.  Patient is calm and pleasant.  Makes needs known to staff.  Denies avh shi or plan.  Will monitor.

## 2023-09-07 NOTE — ED Notes (Signed)
Patient calm and cooperative with cae. No distress or signs of withdrawal.  He is quiet on unit.  No evidence of withdrawal.  Will monitor.

## 2023-09-07 NOTE — ED Notes (Signed)
Patients B/P = 153/92 provider made aware.  No new orders. Patient asymptomatic.

## 2023-09-07 NOTE — Group Note (Signed)
Group Topic: Communication  Group Date: 09/07/2023 Start Time: 2000 End Time: 2100 Facilitators: Rae Lips B  Department: [facility]  Number of Participants: 2  Group Focus: anxiety, check in, communication, coping skills, daily focus, depression, feeling awareness/expression, forgiveness, goals/reality orientation, healthy friendships, loss/grief issues, relaxation, self-esteem, social skills, and substance abuse education Treatment Modality:  Individual Therapy, Leisure Development, and Psychoeducation Interventions utilized were leisure development, patient education, problem solving, story telling, and support Purpose: enhance coping skills, express feelings, express irrational fears, increase insight, regain self-worth, reinforce self-care, and relapse prevention strategies  Name: Brett Gilbert Date of Birth: June 29, 1986  MR: [id number]    Level of Participation: PT DID NOT ATTEND GROUPS Quality of Participation: cooperative Interactions with others: gave feedback Mood/Affect: appropriate Triggers (if applicable): NA Cognition: coherent/clear Progress: Gaining insight Response: NA Plan: patient will be encouraged to go to groups.   Patients Problems:  Patient Active Problem List   Diagnosis Date Noted   Substance use disorder 09/06/2023   Opioid use disorder 06/23/2022

## 2023-09-07 NOTE — ED Notes (Signed)
 Patient is sleeping. Respirations equal and unlabored, skin warm and dry. No change in assessment or acuity. Routine safety checks conducted according to facility protocol. Will continue to monitor for safety.

## 2023-09-07 NOTE — ED Provider Notes (Signed)
Behavioral Health Progress Note  Date and Time: 09/07/2023 11:23 AM Name: Brett Gilbert MRN:  [id number]  Brett Gilbert is a 38 yo male with a a long past history of substance use disorder primarily heroin and fentanyl and cocaine. He presented to the [facility] behavioral health care voluntarily requesting substance abuse treatment.   Subjective:   Patient reports he presented to this facility because he was found in the bathroom of his job, unresponsive due to accidental overdose requiring Narcan.  He reports he is tired of using his jobs as a way to support his substance use.  He reports this is the primary reason why he opted to discharge without follow-up to residential rehabilitation back in December.  He reports he is now motivated to get his life back together and is specifically interested in residential rehabilitation.  Regarding his history of opioid use, he reports he he started using heroin 3 years ago.  He reports only using intranasally, denies any history of IV drug use.  He reports he uses daily, but is unable to specify the quantity.  He reports that since starting to use heroin he has had multiple accidental overdoses, reporting about 3-4 since he started using.  When asked about cravings, he states that they are always present.  Denies any significant withdrawal symptoms.  Patient reports adequate sleep and appetite.  Denies any suicidal ideations, homicidal ideations, or hallucinations.  No apparent paranoid ideations.  No apparent delusional thought processes.   Diagnosis:  Final diagnoses:  Opioid use disorder, severe, dependence (HCC)  Tobacco use disorder  Cocaine use disorder, severe, dependence (HCC)  Cannabis use, uncomplicated    Total Time spent with patient: 1.5 hours  Past Psychiatric History:  No prior psych hospitlizations. No formal trials of psychotropic meds.  No current psychitrist of therapist. Denies any hx of suicidal behaviors, no hx  of SI. No hx of NSSIB. Substance Use History:  Pt reports that he has been using cocaine intermittently since he was 38 years old but has recently escalated. He has been abusing 1-2 times per week on average and claims that he prefers opioids to cocaine. He has been using fentanyl since age 58. He states that he has also been using heroin. He denies alcohol use.  Tobacco: smokes 10-15 ppd, since age 74 Cannabis: uses occasionally Past Medical History:  No chronic medical conditions Allergies: seasonal Surgeries: no Seizures: Denies TBI/concussions/LOC: denies all Family History: Unknown to pt Family Psychiatric  History:  Father-schizophrenia Suicide hx: denies Social History:  Patient is living in [place] with his father, who has schizophrenia.  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.He reports it supplies his use sinc ehe gets paid daily Children: no kids Marital status: single Military: No Firearms: Denies Legal: no pending legal charges or upcoming court dates   Current Medications:  Current Facility-Administered Medications  Medication Dose Route Frequency Provider Last Rate Last Admin   acetaminophen (TYLENOL) tablet 650 mg  650 mg Oral Q6H PRN White, Patrice L, NP       alum & mag hydroxide-simeth (MAALOX/MYLANTA) 200-200-20 MG/5ML suspension 30 mL  30 mL Oral Q4H PRN White, Patrice L, NP       cloNIDine (CATAPRES) tablet 0.1 mg  0.1 mg Oral QID White, Patrice L, NP   0.1 mg at 09/07/23 0920   Followed by   Melene Muller ON 09/08/2023] cloNIDine (CATAPRES) tablet 0.1 mg  0.1 mg Oral BH-qamhs White, Patrice L, NP  Followed by   Melene Muller ON 09/11/2023] cloNIDine (CATAPRES) tablet 0.1 mg  0.1 mg Oral QAC breakfast White, Patrice L, NP       dicyclomine (BENTYL) tablet 20 mg  20 mg Oral Q6H PRN White, Patrice L, NP       hydrOXYzine (ATARAX) tablet 25 mg  25 mg Oral TID PRN White, Patrice L, NP   25 mg at 09/06/23 2114   loperamide (IMODIUM) capsule 2-4  mg  2-4 mg Oral PRN White, Patrice L, NP       magnesium hydroxide (MILK OF MAGNESIA) suspension 30 mL  30 mL Oral Daily PRN White, Patrice L, NP       methocarbamol (ROBAXIN) tablet 500 mg  500 mg Oral Q8H PRN White, Patrice L, NP       naproxen (NAPROSYN) tablet 500 mg  500 mg Oral BID PRN White, Patrice L, NP       ondansetron (ZOFRAN-ODT) disintegrating tablet 4 mg  4 mg Oral Q6H PRN White, Patrice L, NP       traZODone (DESYREL) tablet 50 mg  50 mg Oral QHS PRN White, Patrice L, NP   50 mg at 09/06/23 2114   No current outpatient medications on file.    Labs  Lab Results:  Admission on 09/06/2023, Discharged on 09/06/2023  Component Date Value Ref Range Status   WBC 09/06/2023 7.0  4.0 - 10.5 K/uL Final   RBC 09/06/2023 4.45  4.22 - 5.81 MIL/uL Final   Hemoglobin 09/06/2023 12.5 (L)  13.0 - 17.0 g/dL Final   HCT 81/19/1478 39.1  39.0 - 52.0 % Final   MCV 09/06/2023 87.9  80.0 - 100.0 fL Final   MCH 09/06/2023 28.1  26.0 - 34.0 pg Final   MCHC 09/06/2023 32.0  30.0 - 36.0 g/dL Final   RDW 29/56/2130 14.1  11.5 - 15.5 % Final   Platelets 09/06/2023 254  150 - 400 K/uL Final   nRBC 09/06/2023 0.0  0.0 - 0.2 % Final   Neutrophils Relative % 09/06/2023 70  % Final   Neutro Abs 09/06/2023 5.0  1.7 - 7.7 K/uL Final   Lymphocytes Relative 09/06/2023 18  % Final   Lymphs Abs 09/06/2023 1.2  0.7 - 4.0 K/uL Final   Monocytes Relative 09/06/2023 8  % Final   Monocytes Absolute 09/06/2023 0.6  0.1 - 1.0 K/uL Final   Eosinophils Relative 09/06/2023 3  % Final   Eosinophils Absolute 09/06/2023 0.2  0.0 - 0.5 K/uL Final   Basophils Relative 09/06/2023 1  % Final   Basophils Absolute 09/06/2023 0.0  0.0 - 0.1 K/uL Final   Immature Granulocytes 09/06/2023 0  % Final   Abs Immature Granulocytes 09/06/2023 0.01  0.00 - 0.07 K/uL Final   Performed at [facility], [address], [place], [place] [address]   Sodium 09/06/2023 142  135 - 145 mmol/L Final   Potassium 09/06/2023 3.9  3.5 -  5.1 mmol/L Final   Chloride 09/06/2023 103  98 - 111 mmol/L Final   CO2 09/06/2023 28  22 - 32 mmol/L Final   Glucose, Bld 09/06/2023 97  70 - 99 mg/dL Final   Glucose reference range applies only to samples taken after fasting for at least 8 hours.   BUN 09/06/2023 24 (H)  6 - 20 mg/dL Final   Creatinine, Ser 09/06/2023 1.04  0.61 - 1.24 mg/dL Final   Calcium 46/96/2952 9.7  8.9 - 10.3 mg/dL Final   Total Protein  09/06/2023 7.1  6.5 - 8.1 g/dL Final   Albumin 16/05/9603 3.6  3.5 - 5.0 g/dL Final   AST 54/04/8118 17  15 - 41 U/L Final   ALT 09/06/2023 25  0 - 44 U/L Final   Alkaline Phosphatase 09/06/2023 71  38 - 126 U/L Final   Total Bilirubin 09/06/2023 0.5  0.0 - 1.2 mg/dL Final   GFR, Estimated 09/06/2023 >60  >60 mL/min Final   Comment: (NOTE) Calculated using the CKD-EPI Creatinine Equation (2021)    Anion gap 09/06/2023 11  5 - 15 Final   Performed at [facility], [address], [place], [place] [address]   Hgb A1c MFr Bld 09/06/2023 6.1 (H)  4.8 - 5.6 % Final   Comment: (NOTE) Pre diabetes:          5.7%-6.4%  Diabetes:              >6.4%  Glycemic control for   <7.0% adults with diabetes    Mean Plasma Glucose 09/06/2023 128.37  mg/dL Final   Performed at [facility], [address], [place], [place] [address]   Alcohol, Ethyl (B) 09/06/2023 <10  <10 mg/dL Final   Comment: (NOTE) Lowest detectable limit for serum alcohol is 10 mg/dL.  For medical purposes only. Performed at [facility], [address] [place], [place] [address]    Cholesterol 09/06/2023 173  0 - 200 mg/dL Final   Triglycerides 78/46/9629 49  <150 mg/dL Final   HDL 52/84/1324 71  >40 mg/dL Final   Total CHOL/HDL Ratio 09/06/2023 2.4  RATIO Final   VLDL 09/06/2023 10  0 - 40 mg/dL Final   LDL Cholesterol 09/06/2023 92  0 - 99 mg/dL Final   Comment:        Total Cholesterol/HDL:CHD Risk Coronary Heart Disease Risk Table                     Men   Women  1/2 Average Risk    3.4   3.3  Average Risk       5.0   4.4  2 X Average Risk   9.6   7.1  3 X Average Risk  23.4   11.0        Use the calculated Patient Ratio above and the CHD Risk Table to determine the patient's CHD Risk.        ATP III CLASSIFICATION (LDL):  <100     mg/dL   Optimal  401-027  mg/dL   Near or Above                    Optimal  130-159  mg/dL   Borderline  253-664  mg/dL   High  >403     mg/dL   Very High Performed at [facility], [address] [place], [place] [address]    TSH 09/06/2023 0.372  0.350 - 4.500 uIU/mL Final   Comment: Performed by a 3rd Generation assay with a functional sensitivity of <=0.01 uIU/mL. Performed at [facility], [address], [place], [place] [address]    Hepatitis B Surface Ag 09/06/2023 NON REACTIVE  NON REACTIVE Final   HCV Ab 09/06/2023 NON REACTIVE  NON REACTIVE Final   Comment: (NOTE) Nonreactive HCV antibody screen is consistent with no HCV infections,  unless recent infection is suspected or other evidence exists to indicate HCV infection.     Hep A  IgM 09/06/2023 NON REACTIVE  NON REACTIVE Final   Hep B C IgM 09/06/2023 NON REACTIVE  NON REACTIVE Final   Performed at [facility], [address], [place], [place] [address]   RPR Ser Ql 09/06/2023 NON REACTIVE  NON REACTIVE Final   Performed at [facility], [address] [place], [place] [address]   POC Amphetamine UR 09/06/2023 None Detected  NONE DETECTED (Cut Off Level 1000 ng/mL) Final   POC Secobarbital (BAR) 09/06/2023 None Detected  NONE DETECTED (Cut Off Level 300 ng/mL) Final   POC Buprenorphine (BUP) 09/06/2023 None Detected  NONE DETECTED (Cut Off Level 10 ng/mL) Final   POC Oxazepam (BZO) 09/06/2023 None Detected  NONE DETECTED (Cut Off Level 300 ng/mL) Final   POC Cocaine UR 09/06/2023 Positive (A)  NONE DETECTED (Cut Off Level 300 ng/mL) Final   POC Methamphetamine UR 09/06/2023 None Detected  NONE DETECTED (Cut Off Level 1000 ng/mL) Final    POC Morphine 09/06/2023 None Detected  NONE DETECTED (Cut Off Level 300 ng/mL) Final   POC Methadone UR 09/06/2023 None Detected  NONE DETECTED (Cut Off Level 300 ng/mL) Final   POC Oxycodone UR 09/06/2023 None Detected  NONE DETECTED (Cut Off Level 100 ng/mL) Final   POC Marijuana UR 09/06/2023 Positive (A)  NONE DETECTED (Cut Off Level 50 ng/mL) Final   HIV Screen 4th Generation wRfx 09/06/2023 Non Reactive  Non Reactive Final   Performed at [facility], [address] [place], [place] [address]  Admission on 08/20/2023, Discharged on 08/21/2023  Component Date Value Ref Range Status   Glucose-Capillary 08/20/2023 135 (H)  70 - 99 mg/dL Final   Glucose reference range applies only to samples taken after fasting for at least 8 hours.   WBC 08/20/2023 10.6 (H)  4.0 - 10.5 K/uL Final   RBC 08/20/2023 4.68  4.22 - 5.81 MIL/uL Final   Hemoglobin 08/20/2023 13.2  13.0 - 17.0 g/dL Final   HCT 91/47/8295 40.2  39.0 - 52.0 % Final   MCV 08/20/2023 85.9  80.0 - 100.0 fL Final   MCH 08/20/2023 28.2  26.0 - 34.0 pg Final   MCHC 08/20/2023 32.8  30.0 - 36.0 g/dL Final   RDW 62/13/0865 13.8  11.5 - 15.5 % Final   Platelets 08/20/2023 226  150 - 400 K/uL Final   nRBC 08/20/2023 0.0  0.0 - 0.2 % Final   Performed at [facility], [address], [place], [place] [address]   Sodium 08/20/2023 142  135 - 145 mmol/L Final   Potassium 08/20/2023 4.2  3.5 - 5.1 mmol/L Final   Chloride 08/20/2023 108  98 - 111 mmol/L Final   CO2 08/20/2023 24  22 - 32 mmol/L Final   Glucose, Bld 08/20/2023 130 (H)  70 - 99 mg/dL Final   Glucose reference range applies only to samples taken after fasting for at least 8 hours.   BUN 08/20/2023 21 (H)  6 - 20 mg/dL Final   Creatinine, Ser 08/20/2023 1.15  0.61 - 1.24 mg/dL Final   Calcium 62/95/2841 9.6  8.9 - 10.3 mg/dL Final   GFR, Estimated 08/20/2023 >60  >60 mL/min Final   Comment: (NOTE) Calculated using the CKD-EPI Creatinine Equation (2021)    Anion  gap 08/20/2023 10  5 - 15 Final   Performed at [facility], [address] [place], [place] [address]  Admission on 08/06/2023, Discharged on 08/07/2023  Component Date Value  Ref Range Status   Free T4 08/06/2023 1.10  0.61 - 1.12 ng/dL Final   Comment: (NOTE) Biotin ingestion may interfere with free T4 tests. If the results are inconsistent with the TSH level, previous test results, or the clinical presentation, then consider biotin interference. If needed, order repeat testing after stopping biotin. Performed at [facility], [address] [place], [place] [address]    T3, Free 08/06/2023 3.3  2.0 - 4.4 pg/mL Final   Comment: (NOTE) Performed At: [facility] [address] [place], [place] [id number] Jolene Schimke MD ZD:[id number]   Admission on 08/06/2023, Discharged on 08/06/2023  Component Date Value Ref Range Status   WBC 08/06/2023 8.2  4.0 - 10.5 K/uL Final   RBC 08/06/2023 4.79  4.22 - 5.81 MIL/uL Final   Hemoglobin 08/06/2023 13.3  13.0 - 17.0 g/dL Final   HCT 95/18/8416 41.6  39.0 - 52.0 % Final   MCV 08/06/2023 86.8  80.0 - 100.0 fL Final   MCH 08/06/2023 27.8  26.0 - 34.0 pg Final   MCHC 08/06/2023 32.0  30.0 - 36.0 g/dL Final   RDW 60/63/0160 13.5  11.5 - 15.5 % Final   Platelets 08/06/2023 320  150 - 400 K/uL Final   nRBC 08/06/2023 0.0  0.0 - 0.2 % Final   Neutrophils Relative % 08/06/2023 76  % Final   Neutro Abs 08/06/2023 6.2  1.7 - 7.7 K/uL Final   Lymphocytes Relative 08/06/2023 18  % Final   Lymphs Abs 08/06/2023 1.5  0.7 - 4.0 K/uL Final   Monocytes Relative 08/06/2023 4  % Final   Monocytes Absolute 08/06/2023 0.4  0.1 - 1.0 K/uL Final   Eosinophils Relative 08/06/2023 1  % Final   Eosinophils Absolute 08/06/2023 0.1  0.0 - 0.5 K/uL Final   Basophils Relative 08/06/2023 1  % Final   Basophils Absolute 08/06/2023 0.0  0.0 - 0.1 K/uL Final   Immature Granulocytes 08/06/2023 0  % Final   Abs Immature Granulocytes 08/06/2023 0.02   0.00 - 0.07 K/uL Final   Performed at [facility], [address], [place], [place] [address]   Sodium 08/06/2023 139  135 - 145 mmol/L Final   Potassium 08/06/2023 4.7  3.5 - 5.1 mmol/L Final   Chloride 08/06/2023 100  98 - 111 mmol/L Final   CO2 08/06/2023 29  22 - 32 mmol/L Final   Glucose, Bld 08/06/2023 82  70 - 99 mg/dL Final   Glucose reference range applies only to samples taken after fasting for at least 8 hours.   BUN 08/06/2023 15  6 - 20 mg/dL Final   Creatinine, Ser 08/06/2023 0.97  0.61 - 1.24 mg/dL Final   Calcium 35/57/3220 9.9  8.9 - 10.3 mg/dL Final   Total Protein 25/42/7062 7.5  6.5 - 8.1 g/dL Final   Albumin 37/62/8315 4.2  3.5 - 5.0 g/dL Final   AST 17/61/6073 34  15 - 41 U/L Final   ALT 08/06/2023 40  0 - 44 U/L Final   Alkaline Phosphatase 08/06/2023 81  38 - 126 U/L Final   Total Bilirubin 08/06/2023 1.0  0.0 - 1.2 mg/dL Final   GFR, Estimated 08/06/2023 >60  >60 mL/min Final   Comment: (NOTE) Calculated using the CKD-EPI Creatinine Equation (2021)    Anion gap 08/06/2023 10  5 - 15 Final   Performed at [facility], [address], [place], [place] [address]   Hgb A1c MFr Bld 08/06/2023  6.1 (H)  4.8 - 5.6 % Final   Comment: (NOTE)         Prediabetes: 5.7 - 6.4         Diabetes: >6.4         Glycemic control for adults with diabetes: <7.0    Mean Plasma Glucose 08/06/2023 128  mg/dL Final   Comment: (NOTE) Performed At: [facility] [address] [place], [place] [id number] Jolene Schimke MD WG:[id number]    Magnesium 08/06/2023 2.3  1.7 - 2.4 mg/dL Final   Performed at [facility], [address], [place], [place] [address]   Alcohol, Ethyl (B) 08/06/2023 <10  <10 mg/dL Final   Comment: (NOTE) Lowest detectable limit for serum alcohol is 10 mg/dL.  For medical purposes only. Performed at [facility], [address], [place], [place] [address]    Cholesterol 08/06/2023 171  0 - 200 mg/dL Final   Triglycerides  08/06/2023 51  <150 mg/dL Final   HDL 84/13/2440 64  >40 mg/dL Final   Total CHOL/HDL Ratio 08/06/2023 2.7  RATIO Final   VLDL 08/06/2023 10  0 - 40 mg/dL Final   LDL Cholesterol 08/06/2023 97  0 - 99 mg/dL Final   Comment:        Total Cholesterol/HDL:CHD Risk Coronary Heart Disease Risk Table                     Men   Women  1/2 Average Risk   3.4   3.3  Average Risk       5.0   4.4  2 X Average Risk   9.6   7.1  3 X Average Risk  23.4   11.0        Use the calculated Patient Ratio above and the CHD Risk Table to determine the patient's CHD Risk.        ATP III CLASSIFICATION (LDL):  <100     mg/dL   Optimal  102-725  mg/dL   Near or Above                    Optimal  130-159  mg/dL   Borderline  366-440  mg/dL   High  >347     mg/dL   Very High Performed at [facility], [address], [place], [place] [address]    TSH 08/06/2023 0.298 (L)  0.350 - 4.500 uIU/mL Final   Comment: Performed by a 3rd Generation assay with a functional sensitivity of <=0.01 uIU/mL. Performed at [facility], [address] [place], [place] [address]    POC Amphetamine UR 08/06/2023 None Detected  NONE DETECTED (Cut Off Level 1000 ng/mL) Final   POC Secobarbital (BAR) 08/06/2023 None Detected  NONE DETECTED (Cut Off Level 300 ng/mL) Final   POC Buprenorphine (BUP) 08/06/2023 Positive (A)  NONE DETECTED (Cut Off Level 10 ng/mL) Final   POC Oxazepam (BZO) 08/06/2023 None Detected  NONE DETECTED (Cut Off Level 300 ng/mL) Final   POC Cocaine UR 08/06/2023 Positive (A)  NONE DETECTED (Cut Off Level 300 ng/mL) Final   POC Methamphetamine UR 08/06/2023 None Detected  NONE DETECTED (Cut Off Level 1000 ng/mL) Final   POC Morphine 08/06/2023 None Detected  NONE DETECTED (Cut Off Level 300 ng/mL) Final   POC Methadone UR 08/06/2023 None Detected  NONE DETECTED (Cut Off Level 300 ng/mL) Final   POC Oxycodone UR 08/06/2023 None Detected  NONE DETECTED (Cut  Off Level 100 ng/mL) Final   POC  Marijuana UR 08/06/2023 Positive (A)  NONE DETECTED (Cut Off Level 50 ng/mL) Final    Blood Alcohol level:  Lab Results  Component Value Date   ETH <10 09/06/2023   ETH <10 08/06/2023    Metabolic Disorder Labs: Lab Results  Component Value Date   HGBA1C 6.1 (H) 09/06/2023   MPG 128.37 09/06/2023   MPG 128 08/06/2023   No results found for: "PROLACTIN" Lab Results  Component Value Date   CHOL 173 09/06/2023   TRIG 49 09/06/2023   HDL 71 09/06/2023   CHOLHDL 2.4 09/06/2023   VLDL 10 09/06/2023   LDLCALC 92 09/06/2023   LDLCALC 97 08/06/2023    Therapeutic Lab Levels: No results found for: "LITHIUM" No results found for: "VALPROATE" No results found for: "CBMZ"  Physical Findings   PHQ2-9    Flowsheet Row ED from 08/06/2023 in [facility] ED from 06/23/2022 in [facility]  PHQ-2 Total Score 5 0  PHQ-9 Total Score 17 --      Flowsheet Row ED from 09/06/2023 in [facility] Most recent reading at 09/06/2023  1:16 PM ED from 09/06/2023 in [facility] Most recent reading at 09/06/2023  9:42 AM ED from 08/06/2023 in [facility] Most recent reading at 08/06/2023  1:50 PM  C-SSRS RISK CATEGORY No Risk No Risk No Risk        Musculoskeletal  Strength & Muscle Tone: within normal limits Gait & Station: normal Patient leans: N/A  Psychiatric Specialty Exam  Presentation  General Appearance:  Appropriate for Environment  Eye Contact: Good  Speech: Clear and Coherent; Normal Rate  Speech Volume: Normal  Handedness: -- (not assessed)   Mood and Affect  Mood: -- ("fine")  Affect: Flat   Thought Process  Thought Processes: Linear  Descriptions of Associations:Intact  Orientation:None  Thought Content:Logical  Diagnosis of Schizophrenia or Schizoaffective disorder in past: No     Hallucinations:Hallucinations: None  Ideas of Reference:None  Suicidal Thoughts:Suicidal Thoughts: No  Homicidal Thoughts:Homicidal Thoughts: No   Sensorium  Memory: Immediate Good; Recent Good; Remote Good  Judgment: Fair  Insight: Fair   Art therapist  Concentration: Good  Attention Span: Good  Recall: Good  Fund of Knowledge: Good  Language: Good   Psychomotor Activity  Psychomotor Activity: Psychomotor Activity: Decreased   Assets  Assets: Desire for Improvement; Resilience; Communication Skills   Sleep  Sleep: Sleep: Good   Nutritional Assessment (For OBS and FBC admissions only) Has the patient had a weight loss or gain of 10 pounds or more in the last 3 months?: No Has the patient had a decrease in food intake/or appetite?: No Does the patient have dental problems?: No Does the patient have eating habits or behaviors that may be indicators of an eating disorder including binging or inducing vomiting?: No Has the patient recently lost weight without trying?: 0 Has the patient been eating poorly because of a decreased appetite?: 0 Malnutrition Screening Tool Score: 0    Physical Exam  Physical Exam Vitals and nursing note reviewed.  Constitutional:      General: He is not in acute distress.    Appearance: He is not ill-appearing.  HENT:     Head: Normocephalic and atraumatic.  Pulmonary:     Effort: Pulmonary effort is normal. No respiratory distress.  Skin:    General: Skin is warm and dry.  Review of Systems  All other systems reviewed and are negative.  Blood pressure (!) 148/86, pulse 73, temperature 97.9 F (36.6 C), temperature source Oral, resp. rate 18, SpO2 100%. There is no height or weight on file to calculate BMI.  Treatment Plan Summary: Daily contact with patient to assess and evaluate symptoms and progress in treatment and Medication management  Status: Voluntary  Opioid Use Disorder -COWS  monitoring  -COWS score is 1 at 6:05Pm on 1/3-/2024 -Clonidine taper -IVDU labs  -see below -Tylenol 650 mg every 6 hours as needed for mild pain -Naproxen 500 mg BID as needed for pain -Bentyl 20 mg every 6 hours as needed for spasms/abdominal cramping -Robaxin 500 mg every 8 hours as needed for muscle spasms -Zofran 4 mg every 6 hours as needed for nausea or vomiting -Imodium 2 to 4 mg as needed for diarrhea or loose stools -Maalox/Mylanta 30 mL every 4 hours as needed for indigestion -Milk of Mag 30 mL as needed for constipation    Labs Reviewed: UDS +cocaine, and +THC EtOH negative CBC shows some mild normocytic anemia A1C 6.1% prediabetic range Lipid panel unremarkable TSH WNL Hepatitis Panel nonreactive Gc/chlamydia pending RPR non-reactive HIV nonreactive  Dispo: Patient specified interest in residential rehabilitation programs, pending refferals  Lorri Frederick, MD 09/07/2023 11:23 AM

## 2023-09-08 DIAGNOSIS — F1120 Opioid dependence, uncomplicated: Secondary | ICD-10-CM

## 2023-09-08 DIAGNOSIS — F1290 Cannabis use, unspecified, uncomplicated: Secondary | ICD-10-CM

## 2023-09-08 DIAGNOSIS — F1420 Cocaine dependence, uncomplicated: Secondary | ICD-10-CM

## 2023-09-08 DIAGNOSIS — F17200 Nicotine dependence, unspecified, uncomplicated: Secondary | ICD-10-CM

## 2023-09-08 MED ORDER — CLONIDINE HCL 0.1 MG PO TABS
0.1000 mg | ORAL_TABLET | Freq: Three times a day (TID) | ORAL | Status: DC | PRN
  Administered 2023-09-08: 0.1 mg via ORAL

## 2023-09-08 NOTE — ED Notes (Signed)
Patient d/c with all belongings in stable condition. Patient denies SI, HI, AVH. Patient does not appaer to be experiencing withdrawal symptoms.

## 2023-09-08 NOTE — Discharge Instructions (Addendum)
Dear Brett Gilbert,   Non-Emergent / Urgent  [facility] [address] SECOND FLOOR [place], [place] [address] [phone] OUTPATIENT Walk-in information: Please note, all walk-ins are first come & first serve, with limited number of availability.  Please note that to be eligible for services you must bring: ID or a piece of mail with your name [facility] address  Therapist for therapy:  Monday & Wednesdays: Please ARRIVE at 7:15 AM for registration Will START at 8:00 AM Every 1st & 2nd Friday of the month: Please ARRIVE at 10:15 AM for registration Will START at 1 PM - 5 PM  Psychiatrist for medication management: Monday - Friday:  Please ARRIVE at 7:15 AM for registration Will START at 8:00 AM  Regretfully, due to limited availability, please be aware that you may not been seen on the same day as walk-in. Please consider making an appoint or try again. Thank you for your patience and understanding. ________________________________________________________  [facility] URGENT CARE:  [address] FIRST FLOOR.  [place], [place] [address].  [phone]  Mobile Crisis Response Teams Listed by counties in vicinity of [facility] providers [facility] [phone] [facility] [phone] [facility] [phone] [facility] [phone] [place]                * [facility] [phone]                * [facility] [phone] [facility] [phone] [facility]  [phone] * [facility] [phone] ________________________________________________________  To see which pharmacy near you is the CHEAPEST for certain medications, please use GoodRx. It is free website and has a  free phone app.    Also consider looking at [facility] $4.00 or [facility]'s $7.00 prescription list. Both are free to view if googled "[facility] $4 prescription" and "public's $7 prescription". These are set prices, no insurance required. [facility] low cost medications: $4-$15 for 30days prescriptions or $10-$38 for 90days prescriptions

## 2023-09-08 NOTE — ED Notes (Signed)
Pt observed/assessed in room sleeping. RR even and unlabored, appearing in no noted distress. Environmental check complete, will continue to monitor for safety 

## 2023-09-08 NOTE — ED Provider Notes (Addendum)
FBC/OBS ASAP Discharge Summary  Date and Time: 09/08/2023 1:00 PM  Name: Brett Gilbert  MRN:  [id number]   Discharge Diagnoses:  Final diagnoses:  Opioid use disorder, severe, dependence (HCC)  Tobacco use disorder  Cocaine use disorder, severe, dependence (HCC)  Cannabis use, uncomplicated   Brett Gilbert is a 38 y.o. male with PMH of OUD (heroin, fentanyl, no IVDU), cocaine use d/o, cannabis use d/o, tobacco use d/o, no suicide attempt or inpatient psych admission, who presented to the [facility] behavioral health care voluntarily then admitted to [facility] (09/06/2023) requesting substance abuse treatment and opioid detox.  Subjective:  Patient was seen ambulating around the unit, stable gait, appearing restless, somewhat diaphoretic and distressed.  He slept well, appetite is good. Mood is "good".  Denied depressed or anxious mood. York Spaniel that he got admitted to [facility] in [place] last night, did not tell anyone, but he needs to leave and that he has a ride outside at this moment. In response to that [facility] usually does not take patient's on the weekend, he stated that since he has left AMA in the past from the treatment center, they will not give him a ride and he has to find his own ride. Which is why he needs to leave today, because his best friend is outside and is only able to give him a ride today.  Friend lives in [place], friend is also available on Monday to drive him.  But he needs to leave today, declined to give further details.  His friend's name is Tiffany [phone]). He is having severe craving, however report that he feels "great".  He is amenable to additional clonidine PRN.  Denied active and passive SI, HI, AVH, paranoia. Denied access to guns or weapons.  He is aware of the [facility], 911, 988  Declined additional Narcan, reported having a lot.  He lives with dad, in response to giving [place] his phone number, said that his dad is schizophrenic and  does not have a phone.  Stay Summary:  Encounter date: 09/06/2023  Discharge date: 09/08/2023   Requested discharge AMA due to onset of withdraw symptoms and cravings.  H&P: "Patient reports he presented to this facility because he was found in the bathroom of his job, unresponsive due to accidental overdose requiring Narcan.  He reports he is tired of using his jobs as a way to support his substance use.  He reports this is the primary reason why he opted to discharge without follow-up to residential rehabilitation back in December.  He reports he is now motivated to get his life back together and is specifically interested in residential rehabilitation.   Regarding his history of opioid use, he reports he he started using heroin 3 years ago.  He reports only using intranasally, denies any history of IV drug use.  He reports he uses daily, but is unable to specify the quantity.  He reports that since starting to use heroin he has had multiple accidental overdoses, reporting about 3-4 since he started using.   When asked about cravings, he states that they are always present.  Denies any significant withdrawal symptoms.   Patient reports adequate sleep and appetite.  Denies any suicidal ideations, homicidal ideations, or hallucinations.  No apparent paranoid ideations.  No apparent delusional thought processes."   The patient was evaluated each day by a clinical provider to ascertain response to treatment. Improvement was noted by the patient's report of decreasing symptoms, improved sleep and  appetite, affect, medication tolerance, behavior, and participation in unit programming.  Patient was asked each day to complete a self inventory noting mood, mental status, pain, new symptoms, anxiety and concerns.   The patient's medications were managed with the following directions: Opioid Use Disorder (heroin, fentanyl, Suboxone-obtaining from the street) -COWS monitoring -Clonidine taper -IVDU labs              -see below -Tylenol 650 mg every 6 hours as needed for mild pain -Naproxen 500 mg BID as needed for pain -Bentyl 20 mg every 6 hours as needed for spasms/abdominal cramping -Robaxin 500 mg every 8 hours as needed for muscle spasms -Zofran 4 mg every 6 hours as needed for nausea or vomiting -Imodium 2 to 4 mg as needed for diarrhea or loose stools -Maalox/Mylanta 30 mL every 4 hours as needed for indigestion -Milk of Mag 30 mL as needed for constipation     Labs Reviewed: UDS +cocaine, and +THC EtOH negative CBC shows some mild normocytic anemia A1C 6.1% prediabetic range Lipid panel unremarkable TSH WNL Hepatitis Panel nonreactive Gc/chlamydia pending RPR non-reactive HIV nonreactive   Stimulant Use Disorder (cocaine type) Cannabis use d/o -Encourage cessation -PRN Tylenol, maalox/mylanta, milk of magnesia   Tobacco Use Disorder -NRT ordered -Encourage smoking cessation   Patient responded well to medication and being in a therapeutic and supportive environment. Positive and appropriate behavior was noted and the patient was motivated for recovery. The patient worked closely with the treatment team and case manager to develop a discharge plan with appropriate goals. Coping skills, problem solving as well as relaxation therapies were also part of the unit programming.   Total Time spent with patient: 30 minutes  Past Psychiatric History:  No prior psych hospitlizations. No formal trials of psychotropic meds.  No current psychitrist of therapist. Denies any hx of suicidal behaviors, no hx of SI. No hx of NSSIB. Substance Use History:  Pt reports that he has been using cocaine intermittently since he was 38 years old but has recently escalated. He has been abusing 1-2 times per week on average and claims that he prefers opioids to cocaine. He has been using fentanyl since age 85. He states that he has also been using heroin. No IVDU. H/o unintentional overdose  (08/20/2023) He denies alcohol use.  Tobacco: smokes 10-15 ppd, since age 26 Cannabis: uses occasionally Treatment: [facility]: multiple times, h/o leaving AMA due to cravings [facility] on 08/06/2023 and 06/23/2022 and both times he was discharged AMA.  2022-[facility] in [place]-11 days, then went to [facility] for 2 weeks for residential treatment  Past Medical History:  Dx: Does not have PCP, elevated BP on the unit Allergies: Seasonal ic [octacosanol]  Surgeries: no Seizures: Denies TBI/concussions/LOC: denies all Family History: Father-schizophrenia  Family Psychiatric  History:  Father-schizophrenia Suicide hx: denies Social History:  Patient is living in [place] with his father, who has schizophrenia.  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.He reports it supplies his use sinc ehe gets paid daily Children: no kids Marital status: single Military: No Firearms: Denies Legal: no pending legal charges or upcoming court dates Tobacco Cessation:  A prescription for an FDA-approved tobacco cessation medication was offered at discharge and the patient refused  Current Medications:  Current Facility-Administered Medications  Medication Dose Route Frequency Provider Last Rate Last Admin   acetaminophen (TYLENOL) tablet 650 mg  650 mg Oral Q6H PRN White, Chrystine Oiler, NP  alum & mag hydroxide-simeth (MAALOX/MYLANTA) 200-200-20 MG/5ML suspension 30 mL  30 mL Oral Q4H PRN White, Patrice L, NP       cloNIDine (CATAPRES) tablet 0.1 mg  0.1 mg Oral QID White, Patrice L, NP   0.1 mg at 09/08/23 0347   Followed by   cloNIDine (CATAPRES) tablet 0.1 mg  0.1 mg Oral BH-qamhs White, Patrice L, NP       Followed by   Melene Muller ON 09/11/2023] cloNIDine (CATAPRES) tablet 0.1 mg  0.1 mg Oral QAC breakfast White, Patrice L, NP       cloNIDine (CATAPRES) tablet 0.1 mg  0.1 mg Oral TID PRN Princess Bruins, DO   0.1 mg at 09/08/23 1037   dicyclomine (BENTYL) tablet 20 mg  20  mg Oral Q6H PRN White, Patrice L, NP       hydrOXYzine (ATARAX) tablet 25 mg  25 mg Oral TID PRN White, Patrice L, NP   25 mg at 09/08/23 4259   loperamide (IMODIUM) capsule 2-4 mg  2-4 mg Oral PRN White, Patrice L, NP       magnesium hydroxide (MILK OF MAGNESIA) suspension 30 mL  30 mL Oral Daily PRN White, Patrice L, NP       methocarbamol (ROBAXIN) tablet 500 mg  500 mg Oral Q8H PRN White, Patrice L, NP       naproxen (NAPROSYN) tablet 500 mg  500 mg Oral BID PRN White, Patrice L, NP       ondansetron (ZOFRAN-ODT) disintegrating tablet 4 mg  4 mg Oral Q6H PRN White, Patrice L, NP       traZODone (DESYREL) tablet 50 mg  50 mg Oral QHS PRN White, Patrice L, NP   50 mg at 09/07/23 2134   No current outpatient medications on file.    PTA Medications:  Facility Ordered Medications  Medication   acetaminophen (TYLENOL) tablet 650 mg   alum & mag hydroxide-simeth (MAALOX/MYLANTA) 200-200-20 MG/5ML suspension 30 mL   magnesium hydroxide (MILK OF MAGNESIA) suspension 30 mL   hydrOXYzine (ATARAX) tablet 25 mg   traZODone (DESYREL) tablet 50 mg   dicyclomine (BENTYL) tablet 20 mg   loperamide (IMODIUM) capsule 2-4 mg   methocarbamol (ROBAXIN) tablet 500 mg   naproxen (NAPROSYN) tablet 500 mg   ondansetron (ZOFRAN-ODT) disintegrating tablet 4 mg   cloNIDine (CATAPRES) tablet 0.1 mg   Followed by   cloNIDine (CATAPRES) tablet 0.1 mg   Followed by   Melene Muller ON 09/11/2023] cloNIDine (CATAPRES) tablet 0.1 mg   cloNIDine (CATAPRES) tablet 0.1 mg       08/07/2023    3:59 PM 08/06/2023    1:59 PM 06/23/2022    1:39 PM  Depression screen PHQ 2/9  Decreased Interest 3 3 0  Down, Depressed, Hopeless 2 2 0  PHQ - 2 Score 5 5 0  Altered sleeping 3 3   Tired, decreased energy 3 3   Change in appetite 3 3   Feeling bad or failure about yourself  1 1   Trouble concentrating 2 2   Moving slowly or fidgety/restless 0 0   Suicidal thoughts 0 0   PHQ-9 Score 17 17     Flowsheet Row ED from  09/06/2023 in [facility] Most recent reading at 09/06/2023  1:16 PM ED from 09/06/2023 in [facility] Most recent reading at 09/06/2023  9:42 AM ED from 08/06/2023 in [facility] Most recent reading at 08/06/2023  1:50  PM  C-SSRS RISK CATEGORY No Risk No Risk No Risk       Musculoskeletal  Strength & Muscle Tone: within normal limits Gait & Station: normal Patient leans: N/A  Psychiatric Specialty Exam  Presentation  General Appearance:  Disheveled  Eye Contact: Fair  Speech: Clear and Coherent; Normal Rate (Spontaneous)  Speech Volume: Normal  Handedness: Right   Mood and Affect  Mood: -- ("good")  Affect: Non-Congruent; Restricted (appeared dysphoric, irritable)   Thought Process  Thought Processes: Coherent; Goal Directed; Linear  Descriptions of Associations:Intact  Orientation:Full (Time, Place and Person)  Thought Content:Perseveration; Rumination (Wants to leave)  Diagnosis of Schizophrenia or Schizoaffective disorder in past: No    Hallucinations:Hallucinations: None  Ideas of Reference:None  Suicidal Thoughts:Suicidal Thoughts: No  Homicidal Thoughts:Homicidal Thoughts: No   Sensorium  Memory: Immediate Good; Recent Good  Judgment: Poor  Insight: Lacking   Executive Functions  Concentration: Good  Attention Span: Good  Recall: Good  Fund of Knowledge: Good  Language: Good   Psychomotor Activity  Psychomotor Activity: Psychomotor Activity: Increased; Restlessness   Assets  Assets: Manufacturing systems engineer; Desire for Improvement; Housing; Leisure Time   Sleep  Sleep: Sleep: Good   Nutritional Assessment (For OBS and FBC admissions only) Has the patient had a weight loss or gain of 10 pounds or more in the last 3 months?: No Has the patient had a decrease in food intake/or appetite?: No Does the patient have dental problems?:  No Does the patient have eating habits or behaviors that may be indicators of an eating disorder including binging or inducing vomiting?: No Has the patient recently lost weight without trying?: 0 Has the patient been eating poorly because of a decreased appetite?: 0 Malnutrition Screening Tool Score: 0    Physical Exam  Physical Exam Vitals and nursing note reviewed.  Constitutional:      Appearance: He is diaphoretic.  HENT:     Head: Normocephalic and atraumatic.  Pulmonary:     Effort: Pulmonary effort is normal. No respiratory distress.  Neurological:     Mental Status: He is alert and oriented to person, place, and time.     Gait: Gait normal.     Comments: Tremulous, restless    Review of Systems  Constitutional:  Positive for diaphoresis. Negative for malaise/fatigue.  Respiratory:  Negative for shortness of breath.   Cardiovascular:  Negative for chest pain.  Gastrointestinal:  Negative for nausea and vomiting.  Neurological:  Negative for dizziness and headaches.   Blood pressure (!) 142/82, pulse 85, temperature 98.2 F (36.8 C), temperature source Oral, resp. rate 18, SpO2 98%. There is no height or weight on file to calculate BMI.  Demographic Factors:  Male, Low socioeconomic status, and Unemployed  Loss Factors: Decrease in vocational status and Financial problems/change in socioeconomic status  Historical Factors: Impulsivity  Risk Reduction Factors:   Living with another person, especially a relative and Positive social support  Continued Clinical Symptoms:  Alcohol/Substance Abuse/Dependencies  Cognitive Features That Contribute To Risk:  Closed-mindedness, Loss of executive function, Polarized thinking, and Thought constriction (tunnel vision)    Suicide Risk:  Mild:  Suicidal ideation of limited frequency, intensity, duration, and specificity.  There are no identifiable plans, no associated intent, mild dysphoria and related symptoms, good  self-control (both objective and subjective assessment), few other risk factors, and identifiable protective factors, including available and accessible social support.  Plan Of Care/Follow-up recommendations:  Activity:  As tolerated, no restrictions Diet:  Regular, mindful  of carbs due to elevated A1c Tests:  Repeat CBC, A1c. Other:  Do not use alone if you are to use, make sure you have Narcan on hand  Disposition:  Home with friend who will take him to [facility] for residential Lives with dad  Princess Bruins, DO Psych Resident, PGY-3 09/08/2023, 1:00 PM

## 2023-09-08 NOTE — ED Notes (Signed)
Patient A&O x 4, anxious and cooperative. Patient observed with elevated B/P , skin clammy, and restlessness. Patient denies w/d sx, although present. Patient has a congruent mood. Patient informed Clinical research associate who in turn informed the provider and SW that he was accepted into [facility] in [place]. Patient stated he called his ride to transport him there. The writer explained to the patient that he was to notify the staff of acceptance so that a proper d/c plan could occur. Patient received 0.2 mg Clonidine per provides orders for withdrawal symptoms and elevated B/P and atarax 25 mg for anxiousness.

## 2023-09-08 NOTE — ED Notes (Addendum)
Patient denies SI, HI, AVH. Patient verbally contracted for safety.

## 2023-09-13 ENCOUNTER — Ambulatory Visit
Admission: EM | Admit: 2023-09-13 | Discharge: 2023-09-13 | Disposition: A | Discharge status: Home / Self Care | Payer: Commercial Managed Care - HMO

## 2023-09-13 ENCOUNTER — Encounter: Payer: Self-pay | Admitting: Emergency Medicine

## 2023-09-13 DIAGNOSIS — J4520 Mild intermittent asthma, uncomplicated: Secondary | ICD-10-CM | POA: Diagnosis not present

## 2023-09-13 MED ORDER — ALBUTEROL SULFATE HFA 108 (90 BASE) MCG/ACT IN AERS: 1.0000 | INHALATION_SPRAY | Freq: Four times a day (QID) | RESPIRATORY_TRACT | 0 refills | Status: DC | PRN

## 2023-09-13 NOTE — ED Triage Notes (Signed)
 Pt reports productive cough w/ yellow sputum and runny nose x3 days. Pt has hx of asthma and needs refill on inhaler. Cough has caused intermittent dyspnea. Denies fever, sore throat, and body aches.  Will assist with setting up PCP as well

## 2023-09-13 NOTE — ED Provider Notes (Signed)
 [facility]    CSN: [id number] Arrival date & time: 09/13/23  9041      History   Chief Complaint Chief Complaint  Patient presents with   Cough    HPI Brett Gilbert is a 38 y.o. male.   Patient here today for evaluation of cough that he has had for the last 3 days.  He reports that cough is productive at times.  He notes that he has a history of asthma and has had more wheezing recently and needs a refill of his inhaler.  He denies any fever, sore throat or bodyaches.  Does not currently have PCP  The history is provided by the patient.  Cough Associated symptoms: wheezing   Associated symptoms: no chills, no ear pain, no eye discharge, no fever, no shortness of breath and no sore throat     Past Medical History:  Diagnosis Date   Asthma     Patient Active Problem List   Diagnosis Date Noted   Opioid use disorder, severe, dependence (HCC) 09/08/2023   Tobacco use disorder 09/08/2023   Cocaine use disorder, severe, dependence (HCC) 09/08/2023   Cannabis use, uncomplicated 09/08/2023   Substance use disorder 09/06/2023   Opioid use disorder 06/23/2022   Encounter for orthopedic follow-up care 04/17/2019   Pain in finger of right hand 11/08/2018   Open fracture of distal phalanx of finger 08/16/2018    Past Surgical History:  Procedure Laterality Date   AMPUTATION Right 08/14/2018   Procedure: AMPUTATION OF FINGER;  Surgeon: Shari Easter, MD;  [facility];  Service: Orthopedics;  Laterality: Right;   KNEE SURGERY         Home Medications    Prior to Admission medications   Medication Sig Start Date End Date Taking? Authorizing Provider  albuterol  (VENTOLIN  HFA) 108 (90 Base) MCG/ACT inhaler Inhale 1-2 puffs into the lungs every 6 (six) hours as needed for wheezing or shortness of breath. 09/13/23   Billy Asberry FALCON, PA-C    Family History Family History  Problem Relation Age of Onset   Multiple sclerosis Mother     Social  History Social History   Tobacco Use   Smoking status: Some Days    Current packs/day: 0.00    Types: Cigarettes    Last attempt to quit: 01/06/2015    Years since quitting: 8.6   Smokeless tobacco: Never  Vaping Use   Vaping status: Never Used  Substance Use Topics   Alcohol use: Not Currently   Drug use: No     Allergies   Seasonal ic [octacosanol]   Review of Systems Review of Systems  Constitutional:  Negative for chills and fever.  HENT:  Positive for congestion. Negative for ear pain and sore throat.   Eyes:  Negative for discharge and redness.  Respiratory:  Positive for cough and wheezing. Negative for shortness of breath.   Gastrointestinal:  Negative for abdominal pain, nausea and vomiting.     Physical Exam Triage Vital Signs ED Triage Vitals  Encounter Vitals Group     BP 09/13/23 1054 (!) 146/88     Systolic BP Percentile --      Diastolic BP Percentile --      Pulse Rate 09/13/23 1054 76     Resp 09/13/23 1054 18     Temp 09/13/23 1054 97.7 F (36.5 C)     Temp Source 09/13/23 1054 Oral     SpO2 09/13/23 1054 96 %  Weight --      Height --      Head Circumference --      Peak Flow --      Pain Score 09/13/23 1055 0     Pain Loc --      Pain Education --      Exclude from Growth Chart --    No data found.  Updated Vital Signs BP (!) 146/88 (BP Location: Left Arm)   Pulse 76   Temp 97.7 F (36.5 C) (Oral)   Resp 18   SpO2 96%   Visual Acuity Right Eye Distance:   Left Eye Distance:   Bilateral Distance:    Right Eye Near:   Left Eye Near:    Bilateral Near:     Physical Exam Vitals and nursing note reviewed.  Constitutional:      General: He is not in acute distress.    Appearance: Normal appearance. He is not ill-appearing.  HENT:     Head: Normocephalic and atraumatic.     Nose: Congestion present.     Mouth/Throat:     Mouth: Mucous membranes are moist.     Pharynx: Oropharynx is clear. No oropharyngeal exudate or  posterior oropharyngeal erythema.  Eyes:     Conjunctiva/sclera: Conjunctivae normal.  Cardiovascular:     Rate and Rhythm: Normal rate and regular rhythm.     Heart sounds: Normal heart sounds. No murmur heard. Pulmonary:     Effort: Pulmonary effort is normal. No respiratory distress.     Breath sounds: Normal breath sounds. No wheezing, rhonchi or rales.  Skin:    General: Skin is warm and dry.  Neurological:     Mental Status: He is alert.  Psychiatric:        Mood and Affect: Mood normal.        Thought Content: Thought content normal.      UC Treatments / Results  Labs (all labs ordered are listed, but only abnormal results are displayed) Labs Reviewed - No data to display  EKG   Radiology No results found.  Procedures Procedures (including critical care time)  Medications Ordered in UC Medications - No data to display  Initial Impression / Assessment and Plan / UC Course  I have reviewed the triage vital signs and the nursing notes.  Pertinent labs & imaging results that were available during my care of the patient were reviewed by me and considered in my medical decision making (see chart for details).    Albuterol  refilled as requested.  Recommended follow-up if no improvement with same.  Appointment set up for primary care visit in the near future.  Final Clinical Impressions(s) / UC Diagnoses   Final diagnoses:  Mild intermittent asthma without complication   Discharge Instructions   None    ED Prescriptions     Medication Sig Dispense Auth. Provider   albuterol  (VENTOLIN  HFA) 108 (90 Base) MCG/ACT inhaler Inhale 1-2 puffs into the lungs every 6 (six) hours as needed for wheezing or shortness of breath. 8 g Billy Asberry FALCON, PA-C      PDMP not reviewed this encounter.   Billy Asberry FALCON, PA-C 09/13/23 1335

## 2023-09-17 ENCOUNTER — Emergency Department (HOSPITAL_COMMUNITY): Payer: MEDICAID

## 2023-09-17 ENCOUNTER — Emergency Department (HOSPITAL_COMMUNITY)
Admission: EM | Admit: 2023-09-17 | Discharge: 2023-09-18 | Disposition: A | Discharge status: Home / Self Care | Payer: MEDICAID | Attending: Emergency Medicine | Admitting: Emergency Medicine

## 2023-09-17 DIAGNOSIS — R55 Syncope and collapse: Secondary | ICD-10-CM | POA: Insufficient documentation

## 2023-09-17 DIAGNOSIS — J45909 Unspecified asthma, uncomplicated: Secondary | ICD-10-CM | POA: Diagnosis not present

## 2023-09-17 LAB — BASIC METABOLIC PANEL
Anion gap: 10 (ref 5–15)
BUN: 24 mg/dL — ABNORMAL HIGH (ref 6–20)
CO2: 23 mmol/L (ref 22–32)
Calcium: 9.4 mg/dL (ref 8.9–10.3)
Chloride: 106 mmol/L (ref 98–111)
Creatinine, Ser: 1.21 mg/dL (ref 0.61–1.24)
GFR, Estimated: >60 mL/min (ref >60)
Glucose, Bld: 115 mg/dL — ABNORMAL HIGH (ref 70–99)
Potassium: 3.5 mmol/L (ref 3.5–5.1)
Sodium: 139 mmol/L (ref 135–145)

## 2023-09-17 LAB — CBC
HCT: 43.6 % (ref 39.0–52.0)
Hemoglobin: 14 g/dL (ref 13.0–17.0)
MCH: 27.8 pg (ref 26.0–34.0)
MCHC: 32.1 g/dL (ref 30.0–36.0)
MCV: 86.5 fL (ref 80.0–100.0)
Platelets: 237 10*3/uL (ref 150–400)
RBC: 5.04 MIL/uL (ref 4.22–5.81)
RDW: 14 % (ref 11.5–15.5)
WBC: 8.9 10*3/uL (ref 4.0–10.5)
nRBC: 0 % (ref 0.0–0.2)

## 2023-09-17 NOTE — ED Triage Notes (Signed)
 [facility], syncope episode while sitting down, for 20 seconds, pt was at [facility], given suboxone , med hadn't fully dissolved. Pt does not think its a medication reaction, pt was diaphoteric on arrival, not currently for EMS. Pt a/o x 4, warm and dry. Hx of asthma wheezing in upper lobes per EMS

## 2023-09-17 NOTE — ED Provider Triage Note (Signed)
 Emergency Medicine Provider Triage Evaluation Note  Brett Gilbert , a 38 y.o. male  was evaluated in triage.  Pt complains of near syncope today that lasted approximately 15 to 20 seconds after he was given Suboxone.  Denies fall, head trauma.  States that he was able to heal was going on but was unable to respond.  Reports having previous take medication many times without any reaction.  States that he also has had been having a "nagging cough" for the last 5 days with some congestion.  Has ambulated since the fall.  EMS noted wheezing bilaterally because of history of asthma which he says he wheezes as a baseline.  Albuterol  inhaler is at [facility].  Denies any fevers, headache, vision changes, chest pain, abdominal pain, nausea, vomiting, diarrhea, dysuria, hematochezia, melena, lower extremity swelling  Review of Systems  Positive: N/a Negative: N/a  Physical Exam  BP 125/77 (BP Location: Left Arm)   Pulse 88   Temp 98 F (36.7 C) (Oral)   Resp 17   SpO2 97%  Gen:   Awake, no distress   Resp:  Normal effort  MSK:   Moves extremities without difficulty  Other:    Medical Decision Making  Medically screening exam initiated at 8:20 PM.  Appropriate orders placed.  Brett Gilbert was informed that the remainder of the evaluation will be completed by another provider, this initial triage assessment does not replace that evaluation, and the importance of remaining in the ED until their evaluation is complete.     Hayes Lipps, [place] 09/17/23 2023

## 2023-09-18 LAB — URINALYSIS, ROUTINE W REFLEX MICROSCOPIC
Bacteria, UA: NONE SEEN
Bilirubin Urine: NEGATIVE
Glucose, UA: NEGATIVE mg/dL
Hgb urine dipstick: NEGATIVE
Ketones, ur: NEGATIVE mg/dL
Leukocytes,Ua: NEGATIVE
Nitrite: NEGATIVE
Protein, ur: 30 mg/dL — AB
Specific Gravity, Urine: 1.024 (ref 1.005–1.030)
pH: 5 (ref 5.0–8.0)

## 2023-09-18 NOTE — ED Provider Notes (Signed)
[place] EMERGENCY DEPARTMENT AT [facility] Provider Note   CSN: [id number] Arrival date & time: 09/17/23  1959     History  No chief complaint on file.   Brett Gilbert is a 38 y.o. male.  The history is provided by the patient.  Brett Gilbert is a 38 y.o. male who presents to the Emergency Department complaining of syncope.  He presents to the emergency department for evaluation following a near syncopal episode.  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  He has been there for 5 days and received his first dose of Suboxone today.  He states he had just woken up when they gave him the medicine and he became sweaty and was staring off into space.  He states that he did not lose consciousness but was having difficulty responding.  He states that he has had similar episodes in the past and he does not believe it is related to the medication.  He has a history of asthma and EMS reported wheezing.  Patient has no complaints at time of ED assessment.  In particular no fevers, headache, chest pain, difficulty breathing, abdominal pain, nausea, vomiting, diarrhea, leg swelling or pain.     Home Medications Prior to Admission medications   Medication Sig Start Date End Date Taking? Authorizing Provider  albuterol (VENTOLIN HFA) 108 (90 Base) MCG/ACT inhaler Inhale 1-2 puffs into the lungs every 6 (six) hours as needed for wheezing or shortness of breath. 09/13/23   Tomi Bamberger, PA-C      Allergies    Seasonal ic [octacosanol]    Review of Systems   Review of Systems  All other systems reviewed and are negative.   Physical Exam Updated Vital Signs BP 107/74   Pulse 82   Temp 97.7 F (36.5 C) (Oral)   Resp 16   SpO2 93%  Physical Exam Vitals and nursing note reviewed.  Constitutional:      Appearance: He is well-developed.  HENT:     Head: Normocephalic and atraumatic.  Cardiovascular:     Rate and Rhythm:  Normal rate and regular rhythm.     Heart sounds: No murmur heard. Pulmonary:     Effort: Pulmonary effort is normal. No respiratory distress.     Breath sounds: Normal breath sounds.  Abdominal:     Palpations: Abdomen is soft.     Tenderness: There is no abdominal tenderness. There is no guarding or rebound.  Musculoskeletal:        General: No swelling or tenderness.  Skin:    General: Skin is warm and dry.  Neurological:     Mental Status: He is alert and oriented to person, place, and time.  Psychiatric:        Behavior: Behavior normal.     ED Results / Procedures / Treatments   Labs (all labs ordered are listed, but only abnormal results are displayed) Labs Reviewed  BASIC METABOLIC PANEL - Abnormal; Notable for the following components:      Result Value   Glucose, Bld 115 (*)    BUN 24 (*)    All other components within normal limits  URINALYSIS, ROUTINE W REFLEX MICROSCOPIC - Abnormal; Notable for the following components:   Protein, ur 30 (*)    All other components within normal limits  CBC    EKG EKG Interpretation Date/Time:  Monday September 17 2023 20:26:04 EST Ventricular Rate:  90 PR  Interval:  132 QRS Duration:  90 QT Interval:  355 QTC Calculation: 435 R Axis:   109  Text Interpretation: Sinus rhythm Biatrial enlargement Right axis deviation Consider left ventricular hypertrophy Confirmed by Tilden Fossa [phone]) on 09/18/2023 3:33:35 AM  Radiology DG Chest 2 View Result Date: 09/17/2023 CLINICAL DATA:  Wheezing, syncope, short of breath EXAM: CHEST - 2 VIEW COMPARISON:  12/21/2022 FINDINGS: The heart size and mediastinal contours are within normal limits. Both lungs are clear. The visualized skeletal structures are unremarkable. IMPRESSION: No active cardiopulmonary disease. Electronically Signed   By: Sharlet Salina M.D.   On: 09/17/2023 21:35    Procedures Procedures    Medications Ordered in ED Medications - No data to display  ED  Course/ Medical Decision Making/ A&P                                 Medical Decision Making Amount and/or Complexity of Data Reviewed Labs: ordered.   Patient here for evaluation of near syncopal event when he was receiving a dose of Suboxone.  This was brief and lasted 15 to 20 seconds.  Patient is at his baseline and has been well throughout his ED stay.  EKG is nonischemic.  Presentation is not consistent with arrhythmia.  Doubt PE.  CBC without significant anemia.  BMP with mild elevation in his BUN, this is stable when compared to priors.  Chest x-ray is without acute abnormality.  Current clinical picture is not consistent with cardiogenic syncope, anaphylaxis.  Feel patient is stable for discharge back to [facility].  Feel that is reasonable to take additional Suboxone with return precautions.        Final Clinical Impression(s) / ED Diagnoses Final diagnoses:  Near syncope    Rx / DC Orders ED Discharge Orders     None         Tilden Fossa, MD 09/18/23 0422

## 2023-09-18 NOTE — Discharge Instructions (Signed)
It is unclear what triggered your episode today.  Drink plenty of fluids.  It is okay for you to try an additional Suboxone dose.  Make sure that you are fully awake before you take another dose of the medication.  Get rechecked if you have any new or concerning symptoms.

## 2023-10-05 ENCOUNTER — Ambulatory Visit: Payer: Commercial Managed Care - HMO | Admitting: Family Medicine

## 2023-12-12 ENCOUNTER — Encounter: Payer: Self-pay | Admitting: Emergency Medicine

## 2023-12-12 ENCOUNTER — Ambulatory Visit
Admission: EM | Admit: 2023-12-12 | Discharge: 2023-12-12 | Disposition: A | Discharge status: Home / Self Care | Payer: MEDICAID | Attending: Family Medicine | Admitting: Family Medicine

## 2023-12-12 DIAGNOSIS — J4520 Mild intermittent asthma, uncomplicated: Secondary | ICD-10-CM | POA: Diagnosis not present

## 2023-12-12 DIAGNOSIS — F1193 Opioid use, unspecified with withdrawal: Secondary | ICD-10-CM

## 2023-12-12 MED ORDER — METHOCARBAMOL 500 MG PO TABS: 500.0000 mg | ORAL_TABLET | Freq: Three times a day (TID) | ORAL | 0 refills | Status: DC | PRN

## 2023-12-12 MED ORDER — ALBUTEROL SULFATE HFA 108 (90 BASE) MCG/ACT IN AERS: 2.0000 | INHALATION_SPRAY | Freq: Four times a day (QID) | RESPIRATORY_TRACT | 1 refills | Status: AC | PRN

## 2023-12-12 MED ORDER — HYDROXYZINE HCL 50 MG PO TABS: 50.0000 mg | ORAL_TABLET | Freq: Three times a day (TID) | ORAL | 0 refills | Status: AC | PRN

## 2023-12-12 MED ORDER — ALBUTEROL SULFATE HFA 108 (90 BASE) MCG/ACT IN AERS: 2.0000 | INHALATION_SPRAY | Freq: Four times a day (QID) | RESPIRATORY_TRACT | 0 refills | Status: DC | PRN

## 2023-12-12 NOTE — ED Provider Notes (Signed)
 [facility]    CSN: [id number] Arrival date & time: 12/12/23  1024      History   Chief Complaint Chief Complaint  Patient presents with   Medication Refill   Withdrawal    HPI Brett Gilbert is a 38 y.o. male.   Patient with a history of opiate use disorder presents today accompanied by his significant other for a medication refill of his asthma albuterol  inhaler and requested a 1 day supply of Suboxone.  Patient and significant other was advised that our clinic does not provide treatment for substance use disorders however we could refill the albuterol  inhaler.  Patient endorses that he last used opiates 2 days ago and is currently experiencing withdrawal symptoms of insomnia, anxiety and restlessness.  Patient initially presented to [facility] today and was unable to be admitted into the treatment program as he did not have a albuterol  inhaler (listed on his medication treatment list.  Patient's vital signs are stable.  Nurse documented respirations of 10 however on reevaluation patient was breathing out of normal pattern and normal rate of 14 breaths/min.  Past Medical History:  Diagnosis Date   Asthma     Patient Active Problem List   Diagnosis Date Noted   Opioid use disorder, severe, dependence (HCC) 09/08/2023   Tobacco use disorder 09/08/2023   Cocaine use disorder, severe, dependence (HCC) 09/08/2023   Cannabis use, uncomplicated 09/08/2023   Substance use disorder 09/06/2023   Opioid use disorder 06/23/2022   Encounter for orthopedic follow-up care 04/17/2019   Pain in finger of right hand 11/08/2018   Open fracture of distal phalanx of finger 08/16/2018    Past Surgical History:  Procedure Laterality Date   AMPUTATION Right 08/14/2018   Procedure: AMPUTATION OF FINGER;  Surgeon: Arvil Birks, MD;  Location: [facility] OR;  Service: Orthopedics;  Laterality: Right;   KNEE SURGERY         Home Medications    Prior to Admission medications   Medication  Sig Start Date End Date Taking? Authorizing Provider  hydrOXYzine  (ATARAX ) 50 MG tablet Take 1 tablet (50 mg total) by mouth every 8 (eight) hours as needed for anxiety (withdrawal symptoms). 12/12/23  Yes Buena Carmine, NP  methocarbamol  (ROBAXIN ) 500 MG tablet Take 1 tablet (500 mg total) by mouth every 8 (eight) hours as needed for muscle spasms. 12/12/23  Yes Buena Carmine, NP  albuterol  (VENTOLIN  HFA) 108 (90 Base) MCG/ACT inhaler Inhale 2 puffs into the lungs every 6 (six) hours as needed for wheezing or shortness of breath. 12/12/23   Buena Carmine, NP    Family History Family History  Problem Relation Age of Onset   Multiple sclerosis Mother     Social History Social History   Tobacco Use   Smoking status: Some Days    Current packs/day: 0.00    Types: Cigarettes    Last attempt to quit: 01/06/2015    Years since quitting: 8.9   Smokeless tobacco: Never  Vaping Use   Vaping status: Never Used  Substance Use Topics   Alcohol use: Not Currently   Drug use: No     Allergies   Seasonal ic [octacosanol]   Review of Systems Review of Systems Pertinent negatives listed in HPI  Physical Exam Triage Vital Signs ED Triage Vitals  Encounter Vitals Group     BP 12/12/23 1036 123/87     Systolic BP Percentile --      Diastolic BP Percentile --  Pulse Rate 12/12/23 1036 94     Resp 12/12/23 1036 10     Temp 12/12/23 1036 (!) 97.3 F (36.3 C)     Temp Source 12/12/23 1036 Oral     SpO2 12/12/23 1036 96 %     Weight --      Height --      Head Circumference --      Peak Flow --      Pain Score 12/12/23 1037 0     Pain Loc --      Pain Education --      Exclude from Growth Chart --    No data found.  Updated Vital Signs BP 123/87 (BP Location: Right Arm)   Pulse 94   Temp (!) 97.3 F (36.3 C) (Oral)   Resp 10   SpO2 95%   Visual Acuity Right Eye Distance:   Left Eye Distance:   Bilateral Distance:    Right Eye Near:   Left Eye Near:     Bilateral Near:     Physical Exam Vitals reviewed.  Constitutional:      General: He is not in acute distress.    Appearance: Normal appearance. He is not toxic-appearing.  HENT:     Head: Normocephalic and atraumatic.  Cardiovascular:     Rate and Rhythm: Normal rate and regular rhythm.  Pulmonary:     Effort: Pulmonary effort is normal.     Breath sounds: Normal breath sounds.  Musculoskeletal:        General: Normal range of motion.     Cervical back: Normal range of motion and neck supple.  Skin:    General: Skin is warm and dry.     Capillary Refill: Capillary refill takes less than 2 seconds.  Neurological:     General: No focal deficit present.     Mental Status: He is alert and oriented to person, place, and time.  Psychiatric:        Mood and Affect: Mood normal.        Behavior: Behavior normal.      UC Treatments / Results  Labs (all labs ordered are listed, but only abnormal results are displayed) Labs Reviewed - No data to display  EKG   Radiology No results found.  Procedures Procedures (including critical care time)  Medications Ordered in UC Medications - No data to display  Initial Impression / Assessment and Plan / UC Course  I have reviewed the triage vital signs and the nursing notes.  Pertinent labs & imaging results that were available during my care of the patient were reviewed by me and considered in my medical decision making (see chart for details).    Mild intermittent asthma without acute exacerbation refilled albuterol  inhaler to be used 2 puffs every 6 hours as needed for shortness of breath and/or wheezing.  Opiate use disorder, per patient experiencing active withdrawal symptoms.  Will treat symptoms with hydroxyzine  50 mg every 6 for agitation and insomnia and restless symptoms.  Methocarbamol  500 mg 3 times daily as needed for muscle aches and bodyaches.  Patient encouraged to be present to [facility] tomorrow.  ED if any symptoms  worsen or become severe.  Patient verbalized understanding and agreement with plan. Final Clinical Impressions(s) / UC Diagnoses   Final diagnoses:  Mild intermittent asthma with acute exacerbation   Discharge Instructions   None    ED Prescriptions     Medication Sig Dispense Auth. Provider   albuterol  (VENTOLIN   HFA) 108 (90 Base) MCG/ACT inhaler  (Status: Discontinued) Inhale 2 puffs into the lungs every 6 (six) hours as needed for wheezing or shortness of breath. 8 g Buena Carmine, NP   albuterol  (VENTOLIN  HFA) 108 (90 Base) MCG/ACT inhaler Inhale 2 puffs into the lungs every 6 (six) hours as needed for wheezing or shortness of breath. 8 g Buena Carmine, NP   hydrOXYzine  (ATARAX ) 50 MG tablet Take 1 tablet (50 mg total) by mouth every 8 (eight) hours as needed for anxiety (withdrawal symptoms). 18 tablet Buena Carmine, NP   methocarbamol  (ROBAXIN ) 500 MG tablet Take 1 tablet (500 mg total) by mouth every 8 (eight) hours as needed for muscle spasms. 20 tablet Buena Carmine, NP      PDMP not reviewed this encounter.   Buena Carmine, NP 12/12/23 2320

## 2023-12-12 NOTE — ED Triage Notes (Signed)
 Pt is here for refill of his ventolin  inhaler (90mcg/inhalation) as [facility] facility requires it for his admission. Pt concerned for active opiod withdrawal symptoms: insomnia, restlessness, and anxiety.

## 2024-02-11 ENCOUNTER — Ambulatory Visit: Payer: MEDICAID | Admitting: Physician Assistant

## 2024-02-11 ENCOUNTER — Encounter: Payer: Self-pay | Admitting: Physician Assistant

## 2024-02-11 VITALS — BP 116/85 | HR 60 | Ht 74.0 in | Wt 175.0 lb

## 2024-02-11 DIAGNOSIS — G8929 Other chronic pain: Secondary | ICD-10-CM | POA: Diagnosis not present

## 2024-02-11 DIAGNOSIS — R7303 Prediabetes: Secondary | ICD-10-CM | POA: Diagnosis not present

## 2024-02-11 DIAGNOSIS — M546 Pain in thoracic spine: Secondary | ICD-10-CM

## 2024-02-11 DIAGNOSIS — F1190 Opioid use, unspecified, uncomplicated: Secondary | ICD-10-CM

## 2024-02-11 DIAGNOSIS — R519 Headache, unspecified: Secondary | ICD-10-CM

## 2024-02-11 LAB — POCT GLYCOSYLATED HEMOGLOBIN (HGB A1C): Hemoglobin A1C: 5.6 % (ref 4.0–5.6)

## 2024-02-11 MED ORDER — IBUPROFEN 800 MG PO TABS: 800.0000 mg | ORAL_TABLET | Freq: Three times a day (TID) | ORAL | 0 refills | Status: AC | PRN

## 2024-02-11 MED ORDER — METHOCARBAMOL 500 MG PO TABS: 500.0000 mg | ORAL_TABLET | Freq: Three times a day (TID) | ORAL | 0 refills | Status: AC | PRN

## 2024-02-11 NOTE — Progress Notes (Unsigned)
 New Patient Office Visit  Subjective    Patient ID: Brett Gilbert, male    DOB: 15-Sep-1985  Age: 38 y.o. MRN: [id number]  CC:  Chief Complaint  Patient presents with   Headache    Since last Thursday    Medication Refill   Discussed the use of AI scribe software for clinical note transcription with the patient, who gave verbal consent to proceed.  History of Present Illness  Brett Gilbert is a 38 year old male who presents with persistent headaches since last Thursday.  Headaches are located above the left temple, intermittent, and temporarily relieved by ibuprofen . Sensitivity to light occurs when the headache is severe. No nausea or vomiting is present. Similar headaches have occurred in the past but not recently.  He takes Suboxone since May 7th with no recent dose changes and uses sleep medication, reporting good sleep. He exercises daily, drinks water regularly, and denies alcohol use. Vision has not been checked recently, and he does not wear glasses. Blood sugar is currently normal, though previously in the prediabetic range.  Currently in substance abuse treatment program at [facility] recovery center.  Resident.      Outpatient Encounter Medications as of 02/11/2024  Medication Sig   ibuprofen  (ADVIL ) 800 MG tablet Take 1 tablet (800 mg total) by mouth every 8 (eight) hours as needed.   albuterol  (VENTOLIN  HFA) 108 (90 Base) MCG/ACT inhaler Inhale 2 puffs into the lungs every 6 (six) hours as needed for wheezing or shortness of breath.   hydrOXYzine  (ATARAX ) 50 MG tablet Take 1 tablet (50 mg total) by mouth every 8 (eight) hours as needed for anxiety (withdrawal symptoms).   methocarbamol  (ROBAXIN ) 500 MG tablet Take 1 tablet (500 mg total) by mouth every 8 (eight) hours as needed for muscle spasms.   [DISCONTINUED] methocarbamol  (ROBAXIN ) 500 MG tablet Take 1 tablet (500 mg total) by mouth every 8 (eight) hours as needed for muscle spasms.   No facility-administered  encounter medications on file as of 02/11/2024.    Past Medical History:  Diagnosis Date   Asthma     Past Surgical History:  Procedure Laterality Date   AMPUTATION Right 08/14/2018   Procedure: AMPUTATION OF FINGER;  Surgeon: Shari Easter, MD;  Location: [facility] OR;  Service: Orthopedics;  Laterality: Right;   KNEE SURGERY      Family History  Problem Relation Age of Onset   Multiple sclerosis Mother     Social History   Socioeconomic History   Marital status: Single    Spouse name: Not on file   Number of children: Not on file   Years of education: Not on file   Highest education level: Not on file  Occupational History   Not on file  Tobacco Use   Smoking status: Some Days    Current packs/day: 0.00    Types: Cigarettes    Last attempt to quit: 01/06/2015    Years since quitting: 9.1   Smokeless tobacco: Never  Vaping Use   Vaping status: Never Used  Substance and Sexual Activity   Alcohol use: Not Currently    Comment: Last date of use 12/2023   Drug use: Not Currently    Types: Other-see comments    Comment: opiods   Sexual activity: Not on file  Other Topics Concern   Not on file  Social History Narrative   Not on file   Social Drivers of Health   Financial Resource Strain: Not on file  Food Insecurity: No Food Insecurity (09/06/2023)   Hunger Vital Sign    Worried About Running Out of Food in the Last Year: Never true    Ran Out of Food in the Last Year: Never true  Transportation Needs: No Transportation Needs (09/06/2023)   PRAPARE - Administrator, Civil Service (Medical): No    Lack of Transportation (Non-Medical): No  Physical Activity: Not on file  Stress: Not on file  Social Connections: Not on file  Intimate Partner Violence: Not At Risk (09/06/2023)   Humiliation, Afraid, Rape, and Kick questionnaire    Fear of Current or Ex-Partner: No    Emotionally Abused: No    Physically Abused: No    Sexually Abused: No    Review of Systems   Constitutional:  Negative for chills and fever.  HENT: Negative.    Eyes:  Negative for blurred vision.  Respiratory:  Negative for shortness of breath.   Cardiovascular:  Negative for chest pain.  Gastrointestinal:  Negative for nausea and vomiting.  Genitourinary: Negative.   Musculoskeletal: Negative.   Skin: Negative.   Neurological:  Positive for headaches. Negative for dizziness, speech change and weakness.  Endo/Heme/Allergies: Negative.   Psychiatric/Behavioral: Negative.          Objective    BP 116/85 (BP Location: Left Arm, Patient Position: Sitting, Cuff Size: Large)   Pulse 60   Ht 6' 2 (1.88 m)   Wt 175 lb (79.4 kg)   SpO2 100%   BMI 22.47 kg/m   Physical Exam Vitals and nursing note reviewed.  Constitutional:      Appearance: Normal appearance.  HENT:     Head: Normocephalic and atraumatic.     Right Ear: External ear normal.     Left Ear: External ear normal.     Nose: Nose normal.     Mouth/Throat:     Mouth: Mucous membranes are moist.     Pharynx: Oropharynx is clear.  Eyes:     Extraocular Movements: Extraocular movements intact.     Conjunctiva/sclera: Conjunctivae normal.     Pupils: Pupils are equal, round, and reactive to light.  Cardiovascular:     Rate and Rhythm: Normal rate and regular rhythm.     Pulses: Normal pulses.     Heart sounds: Normal heart sounds.  Pulmonary:     Effort: Pulmonary effort is normal.     Breath sounds: Normal breath sounds.  Musculoskeletal:        General: Normal range of motion.     Cervical back: Normal range of motion and neck supple.  Skin:    General: Skin is warm and dry.  Neurological:     General: No focal deficit present.     Mental Status: He is alert and oriented to person, place, and time.     Cranial Nerves: No cranial nerve deficit.     Sensory: No sensory deficit.     Motor: No weakness.     Gait: Gait normal.  Psychiatric:        Mood and Affect: Mood normal.        Thought  Content: Thought content normal.        Judgment: Judgment normal.      Assessment & Plan:   Problem List Items Addressed This Visit   None Visit Diagnoses       Prediabetes    -  Primary   Relevant Orders   HgB A1c (Completed)  New onset of headaches       Relevant Medications   ibuprofen  (ADVIL ) 800 MG tablet   methocarbamol  (ROBAXIN ) 500 MG tablet   Other Relevant Orders   CBC with Differential/Platelet   Comp. Metabolic Panel (12)   TSH     Chronic midline thoracic back pain       Relevant Medications   ibuprofen  (ADVIL ) 800 MG tablet   methocarbamol  (ROBAXIN ) 500 MG tablet     1. New onset of headaches Patient education given on supportive care, trial ibuprofen  800.  Red flags given for prompt reevaluation - ibuprofen  (ADVIL ) 800 MG tablet; Take 1 tablet (800 mg total) by mouth every 8 (eight) hours as needed.  Dispense: 30 tablet; Refill: 0 - CBC with Differential/Platelet - Comp. Metabolic Panel (12) - TSH  2. Prediabetes (Primary) A1c 5.6. - HgB A1c  3. Chronic midline thoracic back pain Continue current regimen - methocarbamol  (ROBAXIN ) 500 MG tablet; Take 1 tablet (500 mg total) by mouth every 8 (eight) hours as needed for muscle spasms.  Dispense: 20 tablet; Refill: 0  4. Opioid use disorder Currently in substance abuse treatment program   Return if symptoms worsen or fail to improve.   Kirk RAMAN Mayers, PA-C

## 2024-02-11 NOTE — Patient Instructions (Addendum)
 VISIT SUMMARY:  Today, you were seen for persistent headaches that have been occurring since last Thursday. We discussed your symptoms, including the location and nature of your headaches, and reviewed your current medications and lifestyle habits. We also touched on your chronic shoulder and back pain, as well as general health maintenance.  YOUR PLAN:  -HEADACHE: Your headaches are located above your left temple and are sometimes relieved by ibuprofen . You also experience sensitivity to light when the headache is severe. We discussed that the headaches could be due to stress, sodium intake, or vision issues. We have ordered a metabolic panel, thyroid  function tests, and anemia screening to further evaluate the cause. If the headaches persist, consider getting your vision checked as well.  -CHRONIC SHOULDER PAIN: You have chronic shoulder pain that has not shown any new symptoms. We will continue to monitor this condition.  -BACK PAIN: Your chronic back pain is being managed with muscle relaxers. We have refilled your prescription for the muscle relaxers.  -GENERAL HEALTH MAINTENANCE: Your blood pressure and pulse are normal, and you are maintaining good hydration and sleep habits. You are also exercising regularly. However, it has been a while since your last vision check, so we recommend scheduling one soon.  General Headache Without Cause A headache is pain or discomfort felt around the head or neck area. There are many causes and types of headaches. A few common types include: Tension headaches. Migraine headaches. Cluster headaches. Chronic daily headaches. Sometimes, the specific cause of a headache may not be found. Follow these instructions at home: Watch your condition for any changes. Let your health care provider know about them. Take these steps to help with your condition: Managing pain     Take over-the-counter and prescription medicines only as told by your health care  provider. Treatment may include medicines for pain that are taken by mouth or applied to the skin. Lie down in a dark, quiet room when you have a headache. Keep lights dim if bright lights bother you or make your headaches worse. If directed, put ice on your head and neck area: Put ice in a plastic bag. Place a towel between your skin and the bag. Leave the ice on for 20 minutes, 2-3 times per day. Remove the ice if your skin turns bright red. This is very important. If you cannot feel pain, heat, or cold, you have a greater risk of damage to the area. If directed, apply heat to the affected area. Use the heat source that your health care provider recommends, such as a moist heat pack or a heating pad. Place a towel between your skin and the heat source. Leave the heat on for 20-30 minutes. Remove the heat if your skin turns bright red. This is especially important if you are unable to feel pain, heat, or cold. You have a greater risk of getting burned. Eating and drinking Eat meals on a regular schedule. If you drink alcohol: Limit how much you have to: 0-1 drink a day for women who are not pregnant. 0-2 drinks a day for men. Know how much alcohol is in a drink. In the [place] one drink equals one 12 oz bottle of beer (355 mL), one 5 oz glass of wine (148 mL), or one 1 oz glass of hard liquor (44 mL). Stop drinking caffeine, or decrease the amount of caffeine you drink. Drink enough fluid to keep your urine pale yellow. General instructions  Keep a headache journal to help find  out what may trigger your headaches. For example, write down: What you eat and drink. How much sleep you get. Any change to your diet or medicines. Try massage or other relaxation techniques. Limit stress. Sit up straight, and do not tense your muscles. Do not use any products that contain nicotine  or tobacco. These products include cigarettes, chewing tobacco, and vaping devices, such as e-cigarettes. If you  need help quitting, ask your health care provider. Exercise regularly as told by your health care provider. Sleep on a regular schedule. Get 7-9 hours of sleep each night, or the amount recommended by your health care provider. Keep all follow-up visits. This is important. Contact a health care provider if: Medicine does not help your symptoms. You have a headache that is different from your usual headache. You have nausea or you vomit. You have a fever. Get help right away if: Your headache: Becomes severe quickly. Gets worse after moderate to intense physical activity. You have any of these symptoms: Repeated vomiting. Pain or stiffness in your neck. Changes to your vision. Pain in an eye or ear. Problems with speech. Muscular weakness or loss of muscle control. Loss of balance or coordination. You feel faint or pass out. You have confusion. You have a seizure. These symptoms may represent a serious problem that is an emergency. Do not wait to see if the symptoms will go away. Get medical help right away. Call your local emergency services (911 in the [place] Do not drive yourself to the hospital. Summary A headache is pain or discomfort felt around the head or neck area. There are many causes and types of headaches. In some cases, the cause may not be found. Keep a headache journal to help find out what may trigger your headaches. Watch your condition for any changes. Let your health care provider know about them. Contact a health care provider if you have a headache that is different from the usual headache, or if your symptoms are not helped by medicine. Get help right away if your headache becomes severe, you vomit, you have a loss of vision, you lose your balance, or you have a seizure. This information is not intended to replace advice given to you by your health care provider. Make sure you discuss any questions you have with your health care provider. Document Revised:  12/22/2020 Document Reviewed: 12/22/2020 [employer] Patient Education  2024 [employer].

## 2024-02-13 ENCOUNTER — Encounter: Payer: Self-pay | Admitting: Physician Assistant

## 2024-02-20 ENCOUNTER — Ambulatory Visit: Payer: Self-pay | Admitting: Physician Assistant

## 2024-02-20 LAB — CBC WITH DIFFERENTIAL/PLATELET
Basophils Absolute: 0 x10E3/uL (ref 0.0–0.2)
Basos: 1 %
EOS (ABSOLUTE): 0.3 x10E3/uL (ref 0.0–0.4)
Eos: 5 %
Hematocrit: 44 % (ref 37.5–51.0)
Hemoglobin: 12.9 g/dL — ABNORMAL LOW (ref 13.0–17.7)
Immature Grans (Abs): 0 x10E3/uL (ref 0.0–0.1)
Immature Granulocytes: 0 %
Lymphocytes Absolute: 2.3 x10E3/uL (ref 0.7–3.1)
Lymphs: 42 %
MCH: 27.4 pg (ref 26.6–33.0)
MCHC: 29.3 g/dL — ABNORMAL LOW (ref 31.5–35.7)
MCV: 94 fL (ref 79–97)
Monocytes Absolute: 0.6 x10E3/uL (ref 0.1–0.9)
Monocytes: 10 %
Neutrophils Absolute: 2.3 x10E3/uL (ref 1.4–7.0)
Neutrophils: 42 %
Platelets: 241 x10E3/uL (ref 150–450)
RBC: 4.7 x10E6/uL (ref 4.14–5.80)
RDW: 13.7 % (ref 11.6–15.4)
WBC: 5.5 x10E3/uL (ref 3.4–10.8)

## 2024-02-20 LAB — COMP. METABOLIC PANEL (12)
Albumin: 4.5 g/dL (ref 4.1–5.1)
Alkaline Phosphatase: 93 IU/L (ref 44–121)
BUN/Creatinine Ratio: 16 (ref 9–20)
BUN: 27 mg/dL — ABNORMAL HIGH (ref 6–20)
Bilirubin Total: 0.3 mg/dL (ref 0.0–1.2)
Calcium: 8.1 mg/dL — ABNORMAL LOW (ref 8.7–10.2)
Chloride: 92 mmol/L — ABNORMAL LOW (ref 96–106)
Creatinine, Ser: 1.7 mg/dL — ABNORMAL HIGH (ref 0.76–1.27)
Globulin, Total: 3.6 g/dL (ref 1.5–4.5)
Sodium: 141 mmol/L (ref 134–144)
Total Protein: 8.1 g/dL (ref 6.0–8.5)
eGFR: 53 mL/min/1.73 — ABNORMAL LOW (ref >59)

## 2024-02-20 LAB — TSH: TSH: 1.15 u[IU]/mL (ref 0.450–4.500)

## 2024-04-28 ENCOUNTER — Encounter: Payer: Self-pay | Admitting: Physician Assistant

## 2024-04-28 ENCOUNTER — Ambulatory Visit: Payer: MEDICAID | Admitting: Physician Assistant

## 2024-04-28 DIAGNOSIS — J45998 Other asthma: Secondary | ICD-10-CM

## 2024-04-28 NOTE — Progress Notes (Unsigned)
 Pt was not seen. PA-C unable to prescribe supplement. Pt refused to be seen afterwards.
